# Patient Record
Sex: Male | Born: 1965 | Race: White | Hispanic: No | Marital: Married | Smoking: Never smoker
Health system: Southern US, Community
[De-identification: ages and names within clinical notes are randomized; demographics above are authoritative.]

## PROBLEM LIST (undated history)

## (undated) DIAGNOSIS — M109 Gout, unspecified: Secondary | ICD-10-CM

## (undated) DIAGNOSIS — K219 Gastro-esophageal reflux disease without esophagitis: Secondary | ICD-10-CM

## (undated) DIAGNOSIS — I350 Nonrheumatic aortic (valve) stenosis: Secondary | ICD-10-CM

## (undated) DIAGNOSIS — M1990 Unspecified osteoarthritis, unspecified site: Secondary | ICD-10-CM

## (undated) DIAGNOSIS — R011 Cardiac murmur, unspecified: Secondary | ICD-10-CM

## (undated) DIAGNOSIS — E785 Hyperlipidemia, unspecified: Secondary | ICD-10-CM

## (undated) DIAGNOSIS — I10 Essential (primary) hypertension: Secondary | ICD-10-CM

## (undated) HISTORY — DX: Hyperlipidemia, unspecified: E78.5

## (undated) HISTORY — DX: Nonrheumatic aortic (valve) stenosis: I35.0

## (undated) HISTORY — PX: JOINT REPLACEMENT: SHX530

## (undated) HISTORY — PX: FACIAL RECONSTRUCTION SURGERY: SHX631

## (undated) HISTORY — DX: Gastro-esophageal reflux disease without esophagitis: K21.9

## (undated) HISTORY — DX: Unspecified osteoarthritis, unspecified site: M19.90

## (undated) HISTORY — DX: Essential (primary) hypertension: I10

## (undated) HISTORY — DX: Cardiac murmur, unspecified: R01.1

---

## 2000-10-19 ENCOUNTER — Ambulatory Visit (HOSPITAL_COMMUNITY): Admission: RE | Admit: 2000-10-19 | Discharge: 2000-10-19 | Payer: Self-pay | Admitting: Orthopedic Surgery

## 2001-11-24 ENCOUNTER — Emergency Department (HOSPITAL_COMMUNITY): Admission: EM | Admit: 2001-11-24 | Discharge: 2001-11-24 | Payer: Self-pay | Admitting: Emergency Medicine

## 2001-11-24 ENCOUNTER — Encounter: Payer: Self-pay | Admitting: Emergency Medicine

## 2005-09-05 ENCOUNTER — Ambulatory Visit (HOSPITAL_COMMUNITY): Admission: RE | Admit: 2005-09-05 | Discharge: 2005-09-05 | Payer: Self-pay | Admitting: Physician Assistant

## 2006-09-02 ENCOUNTER — Emergency Department (HOSPITAL_COMMUNITY): Admission: EM | Admit: 2006-09-02 | Discharge: 2006-09-02 | Payer: Self-pay | Admitting: *Deleted

## 2010-08-24 NOTE — Consult Note (Signed)
William Welch, William Welch NO.:  [id number]   MEDICAL RECORD NO.:  [id number]          PATIENT TYPE:  EMS   LOCATION:  [facility]                         FACILITY:  [facility]   PHYSICIAN:  Madelynn Done, MD  DATE OF BIRTH:  [birth year]   DATE OF CONSULTATION:  09/02/2006  DATE OF DISCHARGE:                                 CONSULTATION   REASON FOR CONSULTATION:  Left thumb crush injury.   REQUESTING PHYSICIAN:  Doug Sou, M.D., emergency department.   HISTORY OF PRESENT ILLNESS:  William Welch is a 45 year old, right-hand  dominant gentleman who sustained a crush injury to his left thumb, where  he got his thumb caught between two stationary objects.  The patient  presented to the ER with pain and bleeding to the dorsum aspect of his  left thumb.  The patient denies any previous trauma to the left thumb.   IMMUNIZATIONS:  His tetanus shot was up-to-date within the last 5 years.   PAST MEDICAL HISTORY:  No major medical illnesses.   PAST SURGICAL HISTORY:  1. Right knee surgery.  2. Lateral release and bipartite patella excision.   MEDICATIONS:  None.   ALLERGIES:  None known.   SOCIAL HISTORY:  He works in [employer] conservation [employer] business.  He is a  nonsmoker.  He is married.   REVIEW OF SYSTEMS:  No recent illnesses or hospitalizations.   PHYSICAL EXAMINATION:  GENERAL:  He is a healthy-appearing white male in  no acute distress.  He is alert and oriented to person, place and time.  MUSCULOSKELETAL:  On examination of the left hand and thumb, he does  have an approximately 2-cm laceration over the radial border of the  right dorsum eponychial region.  There is not a significant subungual  hematoma.  He is able to extend his thumb from a palm flat position,  flex his thumb IP joint, make the A-okay sign.  He has good composite  grip of his remaining digits.  There are no other areas of trauma or  swelling to the hand.  His fingertips are well perfused.  Sensation  to  light touch is present distally.   RADIOGRAPHS:  AP and lateral films of the left thumb do show a minimally  displaced fracture of the distal phalanx which involves the articular  surface.   IMPRESSION:  1. Left thumb crush injury.  2. Distal phalanx fracture with a small open wound.   PLAN:  Today, the patient's radiographs were reviewed with he and his  wife.  He does have a small laceration and I felt it would be amenable  to just a simple laceration repair.  The patient agreed to proceed.   Lidocaine 1%, 8 mL was used to perform a digital block.  The patient  tolerated this well.  The finger was then prepped with Betadine and then  sterilely draped.  Two 5-0 Prolene sutures were then placed to repair  the simple laceration to the thumb.  The patient tolerated this well.  A  Xeroform dressing and a sterile compressive dressing were then applied.  The patient tolerated the procedure well.  The wound in the thumb was  thoroughly irrigated prior to wound closure and cleansed with Betadine  throughout.   The patient will be seen back in the office in 9 days for a wound check  and suture removal.  He is to keep his bandage clean and dry.  No using  the left thumb.  He has pain medications at home from his previous  surgery which he is going to use.  He is going to come back and see me  sooner if he has problems.      Madelynn Done, MD  Electronically Signed     FWO/MEDQ  D:  09/02/2006  T:  09/02/2006  Job:  [id number]

## 2016-07-26 DIAGNOSIS — T1512XA Foreign body in conjunctival sac, left eye, initial encounter: Secondary | ICD-10-CM | POA: Diagnosis not present

## 2017-05-04 DIAGNOSIS — Z1389 Encounter for screening for other disorder: Secondary | ICD-10-CM | POA: Diagnosis not present

## 2017-05-04 DIAGNOSIS — R011 Cardiac murmur, unspecified: Secondary | ICD-10-CM | POA: Diagnosis not present

## 2017-05-04 DIAGNOSIS — M10062 Idiopathic gout, left knee: Secondary | ICD-10-CM | POA: Diagnosis not present

## 2017-06-16 ENCOUNTER — Encounter: Payer: Self-pay | Admitting: Internal Medicine

## 2017-08-26 ENCOUNTER — Other Ambulatory Visit: Payer: Self-pay

## 2017-08-26 ENCOUNTER — Emergency Department
Admission: EM | Admit: 2017-08-26 | Discharge: 2017-08-26 | Disposition: A | Discharge status: Home / Self Care | Payer: 59 | Attending: Emergency Medicine | Admitting: Emergency Medicine

## 2017-08-26 ENCOUNTER — Encounter: Payer: Self-pay | Admitting: Emergency Medicine

## 2017-08-26 DIAGNOSIS — Y92019 Unspecified place in single-family (private) house as the place of occurrence of the external cause: Secondary | ICD-10-CM | POA: Diagnosis not present

## 2017-08-26 DIAGNOSIS — S0101XA Laceration without foreign body of scalp, initial encounter: Secondary | ICD-10-CM

## 2017-08-26 DIAGNOSIS — W268XXA Contact with other sharp object(s), not elsewhere classified, initial encounter: Secondary | ICD-10-CM | POA: Diagnosis not present

## 2017-08-26 DIAGNOSIS — Y998 Other external cause status: Secondary | ICD-10-CM | POA: Diagnosis not present

## 2017-08-26 DIAGNOSIS — Y9389 Activity, other specified: Secondary | ICD-10-CM | POA: Diagnosis not present

## 2017-08-26 DIAGNOSIS — S0990XA Unspecified injury of head, initial encounter: Secondary | ICD-10-CM | POA: Diagnosis not present

## 2017-08-26 HISTORY — DX: Gout, unspecified: M10.9

## 2017-08-26 MED ORDER — LIDOCAINE-EPINEPHRINE-TETRACAINE (LET) SOLUTION
NASAL | Status: AC
Start: 2017-08-26 — End: 2017-08-26
  Administered 2017-08-26: 3 mL via TOPICAL
  Filled 2017-08-26: qty 3

## 2017-08-26 MED ORDER — TRAMADOL HCL 50 MG PO TABS: 50.0000 mg | ORAL_TABLET | Freq: Four times a day (QID) | ORAL | 0 refills | Status: DC | PRN

## 2017-08-26 MED ORDER — TRAMADOL HCL 50 MG PO TABS
50.0000 mg | ORAL_TABLET | Freq: Once | ORAL | Status: AC
  Administered 2017-08-26: 50 mg via ORAL
  Filled 2017-08-26: qty 1

## 2017-08-26 MED ORDER — LIDOCAINE-EPINEPHRINE-TETRACAINE (LET) SOLUTION
3.0000 mL | Freq: Once | NASAL | Status: AC
  Administered 2017-08-26: 3 mL via TOPICAL

## 2017-08-26 NOTE — ED Triage Notes (Signed)
Was working under house and patient cut top of head on something.  Laceration to top of head, approximately 3 inches log.  Bleeding controlled.

## 2017-08-26 NOTE — ED Provider Notes (Signed)
[facility] Emergency Department Provider Note   ____________________________________________   First MD Initiated Contact with Patient 08/26/17 1323     (approximate)  I have reviewed the triage vital signs and the nursing notes.   HISTORY  Chief Complaint Laceration    HPI William Welch is a 52 y.o. male patient presents with scalp laceration.  Patient was working in his house stood up abruptly and cut his hair on a piece of metal.  Patient denies LOC.  Bleeding controlled with direct pressure.  Patient denies vertigo vision disturbance.  Patient tetanus shot is up-to-date.  Patient denies pain at this time.  Past Medical History:  Diagnosis Date  . Gout     There are no active problems to display for this patient.     Prior to Admission medications   Medication Sig Start Date End Date Taking? Authorizing Provider  traMADol (ULTRAM) 50 MG tablet Take 1 tablet (50 mg total) by mouth every 6 (six) hours as needed for moderate pain. 08/26/17   Sable Feil, PA-C    Allergies Percocet [oxycodone-acetaminophen]  No family history on file.  Social History Social History   Tobacco Use  . Smoking status: Never Smoker  . Smokeless tobacco: Never Used  Substance Use Topics  . Alcohol use: Not on file  . Drug use: Not on file    Review of Systems Constitutional: No fever/chills Eyes: No visual changes. ENT: No sore throat. Cardiovascular: Denies chest pain. Respiratory: Denies shortness of breath. Gastrointestinal: No abdominal pain.  No nausea, no vomiting.  No diarrhea.  No constipation. Genitourinary: Negative for dysuria. Musculoskeletal: Negative for back pain. Skin: Scalp laceration. Neurological: Negative for headaches, focal weakness or numbness. Endocrine:Gout Allergic/Immunilogical: Percocet ____________________________________________   PHYSICAL EXAM:  VITAL SIGNS: ED Triage Vitals  Enc Vitals Group     BP  08/26/17 1253 (!) 166/96     Pulse Rate 08/26/17 1253 91     Resp 08/26/17 1253 16     Temp 08/26/17 1253 98 F (36.7 C)     Temp Source 08/26/17 1253 Oral     SpO2 08/26/17 1253 97 %     Weight 08/26/17 1251 150 lb (68 kg)     Height 08/26/17 1251 5\' 6"  (1.676 m)     Head Circumference --      Peak Flow --      Pain Score 08/26/17 1251 0     Pain Loc --      Pain Edu? --      Excl. in [place]? --    Constitutional: Alert and oriented. Well appearing and in no acute distress. Cardiovascular: Normal rate, regular rhythm. Grossly normal heart sounds.  Good peripheral circulation. Respiratory: Normal respiratory effort.  No retractions. Lungs CTAB. Musculoskeletal: No lower extremity tenderness nor edema.  No joint effusions. Neurologic:  Normal speech and language. No gross focal neurologic deficits are appreciated. No gait instability. Skin:  Skin is warm, dry and intact. No rash noted. Psychiatric: Mood and affect are normal. Speech and behavior are normal.  ____________________________________________   LABS (all labs ordered are listed, but only abnormal results are displayed)  Labs Reviewed - No data to display ____________________________________________  EKG   ____________________________________________  RADIOLOGY  ED MD interpretation:    Official radiology report(s): No results found.  ____________________________________________   PROCEDURES  Procedure(s) performed: None  .Marland KitchenLaceration Repair Date/Time: 08/26/2017 2:21 PM Performed by: Sable Feil, PA-C Authorized by: Sable Feil, PA-C  Consent:    Consent obtained:  Verbal   Consent given by:  Patient   Risks discussed:  Infection, pain and poor cosmetic result Anesthesia (see MAR for exact dosages):    Anesthesia method:  Topical application   Topical anesthetic:  LET Laceration details:    Location:  Scalp   Scalp location:  Crown   Length (cm):  2.5 Repair type:    Repair type:   Simple Pre-procedure details:    Preparation:  Patient was prepped and draped in usual sterile fashion Exploration:    Hemostasis achieved with:  LET Treatment:    Area cleansed with:  Betadine and saline   Amount of cleaning:  Standard   Visualized foreign bodies/material removed: no   Skin repair:    Repair method:  Staples   Number of staples:  5 Approximation:    Approximation:  Close Post-procedure details:    Dressing:  Open (no dressing)   Patient tolerance of procedure:  Tolerated well, no immediate complications    Critical Care performed:   ____________________________________________   INITIAL IMPRESSION / ASSESSMENT AND PLAN / ED COURSE  As part of my medical decision making, I reviewed the following data within the electronic MEDICAL RECORD NUMBER    Scalp laceration.  Area was surgical clean and closed with 5 staples.  Patient given discharge care instructions.  Patient advised to have staples removed by this department or family doctor in 10 days.     ____________________________________________   FINAL CLINICAL IMPRESSION(S) / ED DIAGNOSES  Final diagnoses:  Scalp laceration, initial encounter     ED Discharge Orders        Ordered    traMADol (ULTRAM) 50 MG tablet  Every 6 hours PRN     08/26/17 1420       Note:  This document was prepared using Dragon voice recognition software and may include unintentional dictation errors.    Sable Feil, PA-C 08/26/17 1425    Lavonia Drafts, MD 08/28/17 [phone]

## 2017-08-26 NOTE — ED Notes (Signed)

## 2017-08-26 NOTE — Discharge Instructions (Addendum)
Follow discharge care instructions have staples removed in 10 days.

## 2018-04-30 DIAGNOSIS — R82998 Other abnormal findings in urine: Secondary | ICD-10-CM | POA: Diagnosis not present

## 2018-04-30 DIAGNOSIS — Z0000 Encounter for general adult medical examination without abnormal findings: Secondary | ICD-10-CM | POA: Diagnosis not present

## 2018-05-07 DIAGNOSIS — R030 Elevated blood-pressure reading, without diagnosis of hypertension: Secondary | ICD-10-CM | POA: Diagnosis not present

## 2018-05-07 DIAGNOSIS — M109 Gout, unspecified: Secondary | ICD-10-CM | POA: Diagnosis not present

## 2018-05-07 DIAGNOSIS — Z0000 Encounter for general adult medical examination without abnormal findings: Secondary | ICD-10-CM | POA: Diagnosis not present

## 2018-05-07 DIAGNOSIS — R011 Cardiac murmur, unspecified: Secondary | ICD-10-CM | POA: Diagnosis not present

## 2018-05-09 DIAGNOSIS — Z1212 Encounter for screening for malignant neoplasm of rectum: Secondary | ICD-10-CM | POA: Diagnosis not present

## 2018-06-06 ENCOUNTER — Encounter: Payer: Self-pay | Admitting: Gastroenterology

## 2018-06-12 ENCOUNTER — Encounter (INDEPENDENT_AMBULATORY_CARE_PROVIDER_SITE_OTHER): Payer: Self-pay

## 2018-06-12 ENCOUNTER — Ambulatory Visit (AMBULATORY_SURGERY_CENTER): Payer: Self-pay

## 2018-06-12 ENCOUNTER — Encounter: Payer: Self-pay | Admitting: Gastroenterology

## 2018-06-12 VITALS — Ht 66.0 in | Wt 146.4 lb

## 2018-06-12 DIAGNOSIS — Z1211 Encounter for screening for malignant neoplasm of colon: Secondary | ICD-10-CM

## 2018-06-12 MED ORDER — PEG-KCL-NACL-NASULF-NA ASC-C 140 G PO SOLR: 1.0000 | Freq: Once | ORAL | 0 refills | Status: AC

## 2018-06-12 NOTE — Progress Notes (Signed)
Denies allergies to eggs or soy products. Denies complication of anesthesia or sedation. Denies use of weight loss medication. Denies use of O2.   Emmi instructions declined.   A pay no more than 50.00 coupon for Plenvu was given to the patient.

## 2018-06-25 ENCOUNTER — Telehealth: Payer: Self-pay | Admitting: *Deleted

## 2018-06-25 NOTE — Telephone Encounter (Signed)
Covid-19 travel screening questions  Have you traveled in the last 14 days? No If yes where?  Do you now or have you had a fever in the last 14 days? No  Do you have any respiratory symptoms of shortness of breath or cough now or in the last 14 days? No  Do you have any family members or close contacts with diagnosed or suspected Covid-19? No       

## 2018-06-26 ENCOUNTER — Encounter: Payer: Self-pay | Admitting: Gastroenterology

## 2018-06-26 ENCOUNTER — Ambulatory Visit (AMBULATORY_SURGERY_CENTER): Payer: 59 | Admitting: Gastroenterology

## 2018-06-26 ENCOUNTER — Other Ambulatory Visit: Payer: Self-pay

## 2018-06-26 VITALS — BP 126/82 | HR 70 | Temp 97.5°F | Resp 16 | Ht 66.0 in | Wt 146.0 lb

## 2018-06-26 DIAGNOSIS — K635 Polyp of colon: Secondary | ICD-10-CM | POA: Diagnosis not present

## 2018-06-26 DIAGNOSIS — D122 Benign neoplasm of ascending colon: Secondary | ICD-10-CM

## 2018-06-26 DIAGNOSIS — Z1211 Encounter for screening for malignant neoplasm of colon: Secondary | ICD-10-CM

## 2018-06-26 MED ORDER — SODIUM CHLORIDE 0.9 % IV SOLN: 500.0000 mL | Freq: Once | INTRAVENOUS | Status: DC

## 2018-06-26 NOTE — Progress Notes (Signed)
Covid-19 travel screening questions  Have you traveled in the last 14 days? If yes where?  Do you now or have you had a fever in the last 14 days?  Do you have any respiratory symptoms of shortness of breath or cough now or in the last 14 days? no  Do you have a medical history of Congestive Heart Failure?no  Do you have a medical history of lung disease? no  Do you have any family members or close contacts with diagnosed or suspected Covid-19? no

## 2018-06-26 NOTE — Op Note (Signed)
[place] Patient Name: William Welch Procedure Date: 06/26/2018 1:47 PM MRN: [id number] Endoscopist: Mallie Mussel L. Loletha Carrow , MD Age: 53 Referring MD:  Date of Birth: 1965-11-12 Gender: Male Account #: [id number] Procedure:                Colonoscopy Indications:              Screening for colorectal malignant neoplasm, This                            is the patient's first colonoscopy Medicines:                Monitored Anesthesia Care Procedure:                Pre-Anesthesia Assessment:                           - Prior to the procedure, a History and Physical                            was performed, and patient medications and                            allergies were reviewed. The patient's tolerance of                            previous anesthesia was also reviewed. The risks                            and benefits of the procedure and the sedation                            options and risks were discussed with the patient.                            All questions were answered, and informed consent                            was obtained. Prior Anticoagulants: The patient has                            taken no previous anticoagulant or antiplatelet                            agents. ASA Grade Assessment: II - A patient with                            mild systemic disease. After reviewing the risks                            and benefits, the patient was deemed in                            satisfactory condition to undergo the procedure.  After obtaining informed consent, the colonoscope                            was passed under direct vision. Throughout the                            procedure, the patient's blood pressure, pulse, and                            oxygen saturations were monitored continuously. The                            Colonoscope was introduced through the anus and                            advanced to the the cecum,  identified by                            appendiceal orifice and ileocecal valve. The                            colonoscopy was performed without difficulty. The                            patient tolerated the procedure well. The quality                            of the bowel preparation was excellent. The                            ileocecal valve, appendiceal orifice, and rectum                            were photographed. Scope In: 1:55:57 PM Scope Out: 2:08:24 PM Scope Withdrawal Time: 0 hours 9 minutes 16 seconds  Total Procedure Duration: 0 hours 12 minutes 27 seconds  Findings:                 The perianal and digital rectal examinations were                            normal.                           Two sessile polyps were found in the ascending                            colon. The polyps were diminutive in size. These                            polyps were removed with a cold biopsy forceps.                            Resection and retrieval were complete.  A few small-mouthed diverticula were found in the                            sigmoid colon.                           The exam was otherwise without abnormality on                            direct and retroflexion views. Complications:            No immediate complications. Estimated Blood Loss:     Estimated blood loss was minimal. Impression:               - Two diminutive polyps in the ascending colon,                            removed with a cold biopsy forceps. Resected and                            retrieved.                           - Diverticulosis in the sigmoid colon.                           - The examination was otherwise normal on direct                            and retroflexion views. Recommendation:           - Patient has a contact number available for                            emergencies. The signs and symptoms of potential                            delayed  complications were discussed with the                            patient. Return to normal activities tomorrow.                            Written discharge instructions were provided to the                            patient.                           - Resume previous diet.                           - Continue present medications.                           - Await pathology results.                           -  Repeat colonoscopy is recommended for                            surveillance. The colonoscopy date will be                            determined after pathology results from today's                            exam become available for review. Josh Nicolosi L. Loletha Carrow, MD 06/26/2018 2:12:01 PM This report has been signed electronically.

## 2018-06-26 NOTE — Progress Notes (Signed)
To PACU, VSS. Report to [email or web address].tb 

## 2018-06-26 NOTE — Patient Instructions (Signed)
YOU HAD AN ENDOSCOPIC PROCEDURE TODAY AT [facility]:   Refer to the procedure report that was given to you for any specific questions about what was found during the examination.  If the procedure report does not answer your questions, please call your gastroenterologist to clarify.  If you requested that your care partner not be given the details of your procedure findings, then the procedure report has been included in a sealed envelope for you to review at your convenience later.  YOU SHOULD EXPECT: Some feelings of bloating in the abdomen. Passage of more gas than usual.  Walking can help get rid of the air that was put into your GI tract during the procedure and reduce the bloating. If you had a lower endoscopy (such as a colonoscopy or flexible sigmoidoscopy) you may notice spotting of blood in your stool or on the toilet paper. If you underwent a bowel prep for your procedure, you may not have a normal bowel movement for a few days.  Please Note:  You might notice some irritation and congestion in your nose or some drainage.  This is from the oxygen used during your procedure.  There is no need for concern and it should clear up in a day or so.  SYMPTOMS TO REPORT IMMEDIATELY:   Following lower endoscopy (colonoscopy or flexible sigmoidoscopy):  Excessive amounts of blood in the stool  Significant tenderness or worsening of abdominal pains  Swelling of the abdomen that is new, acute  Fever of 100F or higher             Black, tarry-looking stools  For urgent or emergent issues, a gastroenterologist can be reached at any hour by calling [phone].  Please see handouts given to you on Polyps and Diverticulosis.  DIET:  We do recommend a small meal at first, but then you may proceed to your regular diet.  Drink plenty of fluids but you should avoid alcoholic beverages for 24 hours.  ACTIVITY:  You should plan to take it easy for the rest of today and you should NOT  DRIVE or use heavy machinery until tomorrow (because of the sedation medicines used during the test).    FOLLOW UP: Our staff will call the number listed on your records the next business day following your procedure to check on you and address any questions or concerns that you may have regarding the information given to you following your procedure. If we do not reach you, we will leave a message.  However, if you are feeling well and you are not experiencing any problems, there is no need to return our call.  We will assume that you have returned to your regular daily activities without incident.  If any biopsies were taken you will be contacted by phone or by letter within the next 1-3 weeks.  Please call us at [phone] if you have not heard about the biopsies in 3 weeks.    SIGNATURES/CONFIDENTIALITY: You and/or your care partner have signed paperwork which will be entered into your electronic medical record.  These signatures attest to the fact that that the information above on your After Visit Summary has been reviewed and is understood.  Full responsibility of the confidentiality of this discharge information lies with you and/or your care-partner.  Thank you for letting us take care of your healthcare needs today.

## 2018-06-26 NOTE — Progress Notes (Signed)
Pt's states no medical or surgical changes since previsit or office visit. 

## 2018-06-26 NOTE — Progress Notes (Signed)
Called to room to assist during endoscopic procedure.  Patient ID and intended procedure confirmed with present staff. Received instructions for my participation in the procedure from the performing physician.  

## 2018-06-27 ENCOUNTER — Telehealth: Payer: Self-pay

## 2018-06-27 NOTE — Telephone Encounter (Signed)
  Follow up Call-  Call back number 06/26/2018  Post procedure Call Back phone  # [phone]  Permission to leave phone message Yes  Some recent data might be hidden     Patient questions:  Do you have a fever, pain , or abdominal swelling? No. Pain Score  0 *  Have you tolerated food without any problems? Yes.    Have you been able to return to your normal activities? Yes.    Do you have any questions about your discharge instructions: Diet   No. Medications  No. Follow up visit  No.  Do you have questions or concerns about your Care? No.  Actions: * If pain score is 4 or above: No action needed, pain <4.

## 2018-07-03 ENCOUNTER — Encounter: Payer: Self-pay | Admitting: Gastroenterology

## 2020-06-14 NOTE — Progress Notes (Signed)
Cardiology Office Note:    Date:  06/16/2020   ID:  William Welch, DOB 10-02-1965, MRN [id number]  PCP:  William Welch, [place]  Cardiologist:  No primary care provider on file.  Advanced Practice Provider:  No care team member to display Electrophysiologist:  None    Referring MD: William Battles, MD    History of Present Illness:    William Welch is a 55 y.o. male with a hx of HTN, HLD and GERD who was referred by Dr. Philip Welch for further evaluation of dyspnea on exertion and systolic murmur.  Patient states that over the past 6 months he has been experiencing worsening dyspnea on exertion. Has been progressive since that time. Most notable when walking at a fast pace or climbing stairs. No associated chest pain, lightheadedness, palpitations, LE edema, orthopnea or PND. Never smoker. Currently on crestor. Not on antihypertensives.   Family history: Father with MI at 78 (deceased). No known history of valvular disease.    TC 197, HDL 61, LDL 123, TG 74  Past Medical History:  Diagnosis Date  . Arthritis   . GERD (gastroesophageal reflux disease)   . Gout   . Heart murmur   . Hyperlipidemia   . Hypertension     Past Surgical History:  Procedure Laterality Date  . FACIAL RECONSTRUCTION SURGERY    . JOINT REPLACEMENT      Current Medications: Current Meds  Medication Sig  . allopurinol (ZYLOPRIM) 300 MG tablet Take 300 mg by mouth daily.  . colchicine 0.6 MG tablet Take 0.6 mg by mouth as needed.  . ezetimibe (ZETIA) 10 MG tablet Take 1 tablet (10 mg total) by mouth daily.  Marland Kitchen omeprazole (PRILOSEC) 40 MG capsule Take 40 mg by mouth daily.  . rosuvastatin (CRESTOR) 20 MG tablet Take 1 tablet by mouth daily.  . valsartan (DIOVAN) 40 MG tablet Take 1 tablet (40 mg total) by mouth daily.     Allergies:   Percocet [oxycodone-acetaminophen]   Social History   Socioeconomic History  . Marital status: Married    Spouse name: Not on  file  . Number of children: Not on file  . Years of education: Not on file  . Highest education level: Not on file  Occupational History  . Not on file  Tobacco Use  . Smoking status: Never Smoker  . Smokeless tobacco: Never Used  Substance and Sexual Activity  . Alcohol use: Yes    Comment: drinks 5 or 6 beers daily.  . Drug use: Not Currently  . Sexual activity: Not on file  Other Topics Concern  . Not on file  Social History Narrative  . Not on file   Social Determinants of Health   Financial Resource Strain: Not on file  Food Insecurity: Not on file  Transportation Needs: Not on file  Physical Activity: Not on file  Stress: Not on file  Social Connections: Not on file     Family History: The patient's family history is negative for Colon cancer, Esophageal cancer, Rectal cancer, and Stomach cancer.  ROS:   Please see the history of present illness.    Review of Systems  Constitutional: Negative for chills and fever.  HENT: Negative for hearing loss.   Eyes: Negative for blurred vision and redness.  Respiratory: Positive for shortness of breath.   Cardiovascular: Negative for chest pain, palpitations, orthopnea, claudication, leg swelling and PND.  Gastrointestinal: Negative for melena, nausea and vomiting.  Genitourinary: Negative for dysuria and flank pain.  Musculoskeletal: Negative for myalgias.  Neurological: Negative for dizziness and loss of consciousness.  Endo/Heme/Allergies: Negative for polydipsia.  Psychiatric/Behavioral: Negative for substance abuse.    EKGs/Labs/Other Studies Reviewed:    The following studies were reviewed today: No cardiac studies  EKG:  EKG is  ordered today.  The ekg ordered today demonstrates NSR with LVH and repol  Recent Labs: No results found for requested labs within last 8760 hours.  Recent Lipid Panel No results found for: CHOL, TRIG, HDL, CHOLHDL, VLDL, LDLCALC, LDLDIRECT   Risk Assessment/Calculations:        Physical Exam:    VS:  BP (!) 136/100   Pulse 73   Ht 5\' 6"  (1.676 m)   Wt 145 lb 9.6 oz (66 kg)   SpO2 97%   BMI 23.50 kg/m     Wt Readings from Last 3 Encounters:  06/16/20 145 lb 9.6 oz (66 kg)  06/26/18 146 lb (66.2 kg)  06/12/18 146 lb 6.4 oz (66.4 kg)     GEN:  Well nourished, well developed in no acute distress HEENT: Normal NECK: No JVD; No carotid bruits CARDIAC: RRR, 3/6 loud holosystolic murmur heard throughout the precordium. No rubs or gallops RESPIRATORY:  Clear to auscultation without rales, wheezing or rhonchi  ABDOMEN: Soft, non-tender, non-distended MUSCULOSKELETAL:  No edema; No deformity  SKIN: Warm and dry NEUROLOGIC:  Alert and oriented x 3 PSYCHIATRIC:  Normal affect   ASSESSMENT:    1. Murmur   2. Hyperlipidemia, unspecified hyperlipidemia type   3. Hypertension, unspecified type   4. Dyspnea on exertion    PLAN:    In order of problems listed above:  #Systolic Murmur #DOE: Patient with progressive dyspnea on exertion over the past 6 months found to have a loud holosystolic murmur on examination heard throughout the precordium. Findings concerning for symptomatic valvular disease (? MR vs possibly AS). No LE edema, orthopnea, PND, lightheadedness, chest pain or syncope. Will check TTE and pursue further work-up pending echo findings -Check TTE -If TTE without significant valve disease to explain symptoms, will proceed with ischemic work-up  #HTN: ECG with LVH and strain. Suspect elevated blood pressures at home. Not on antihypertensives currently. -Start valsartan 40mg  daily -BMET next week  #HLD: LDL above goal at 123 despite crestor 20mg  daily. -Start zetia 10mg  daily -Continue crestor 20mg  daily -Repeat lipids 6 weeks    Medication Adjustments/Labs and Tests Ordered: Current medicines are reviewed at length with the patient today.  Concerns regarding medicines are outlined above.  Orders Placed This Encounter  Procedures  .  Basic metabolic panel  . EKG 12-Lead  . ECHOCARDIOGRAM COMPLETE   Meds ordered this encounter  Medications  . ezetimibe (ZETIA) 10 MG tablet    Sig: Take 1 tablet (10 mg total) by mouth daily.    Dispense:  90 tablet    Refill:  3  . valsartan (DIOVAN) 40 MG tablet    Sig: Take 1 tablet (40 mg total) by mouth daily.    Dispense:  90 tablet    Refill:  3    Patient Instructions  Medication Instructions:  Start Zetia 10mg  daily Valsartan 40 mg daily  Your physician recommends that you continue on your current medications as directed. Please refer to the Current Medication list given to you today. *If you need a refill on your cardiac medications before your next appointment, please call your pharmacy*   Lab Work: BMET next week If  you have labs (blood work) drawn today and your tests are completely normal, you will receive your results only by: Marland Kitchen MyChart Message (if you have MyChart) OR . A paper copy in the mail If you have any lab test that is abnormal or we need to change your treatment, we will call you to review the results.   Testing/Procedures: ECHO ASAP Your physician has requested that you have an echocardiogram. Echocardiography is a painless test that uses sound waves to create images of your heart. It provides your doctor with information about the size and shape of your heart and how well your heart's chambers and valves are working. This procedure takes approximately one hour. There are no restrictions for this procedure.    Follow-Up: At [facility], you and your health needs are our priority.  As part of our continuing mission to provide you with exceptional heart care, we have created designated Provider Care Teams.  These Care Teams include your primary Cardiologist (physician) and Advanced Practice Providers (APPs -  Physician Assistants and Nurse Practitioners) who all work together to provide you with the care you need, when you need it.  We recommend  signing up for the patient portal called "MyChart".  Sign up information is provided on this After Visit Summary.  MyChart is used to connect with patients for Virtual Visits (Telemedicine).  Patients are able to view lab/test results, encounter notes, upcoming appointments, etc.  Non-urgent messages can be sent to your provider as well.   To learn more about what you can do with MyChart, go to [email or web address].    Your next appointment:   6 week(s) after ECHO  The format for your next appointment:   In Person  Provider:   You may see Dr. Gwyndolyn Kaufman or one of the following Advanced Practice Providers on your designated Care Team:    Richardson Dopp, PA-C  Robbie Lis, [place]         Signed, Freada Bergeron, MD  06/16/2020 1:16 PM    [place]

## 2020-06-14 NOTE — H&P (View-Only) (Signed)
Cardiology Office Note:    Date:  06/16/2020   ID:  William Welch, DOB [birth year], MRN [id number]  PCP:  Leanna Battles, [place]  Cardiologist:  No primary care provider on file.  Advanced Practice Provider:  No care team member to display Electrophysiologist:  None    Referring MD: Leanna Battles, MD    History of Present Illness:    William Welch is a 55 y.o. male with a hx of HTN, HLD and GERD who was referred by Dr. Philip Aspen for further evaluation of dyspnea on exertion and systolic murmur.  Patient states that over the past 6 months he has been experiencing worsening dyspnea on exertion. Has been progressive since that time. Most notable when walking at a fast pace or climbing stairs. No associated chest pain, lightheadedness, palpitations, LE edema, orthopnea or PND. Never smoker. Currently on crestor. Not on antihypertensives.   Family history: Father with MI at 40 (deceased). No known history of valvular disease.    TC 197, HDL 61, LDL 123, TG 74  Past Medical History:  Diagnosis Date  . Arthritis   . GERD (gastroesophageal reflux disease)   . Gout   . Heart murmur   . Hyperlipidemia   . Hypertension     Past Surgical History:  Procedure Laterality Date  . FACIAL RECONSTRUCTION SURGERY    . JOINT REPLACEMENT      Current Medications: Current Meds  Medication Sig  . allopurinol (ZYLOPRIM) 300 MG tablet Take 300 mg by mouth daily.  . colchicine 0.6 MG tablet Take 0.6 mg by mouth as needed.  . ezetimibe (ZETIA) 10 MG tablet Take 1 tablet (10 mg total) by mouth daily.  Marland Kitchen omeprazole (PRILOSEC) 40 MG capsule Take 40 mg by mouth daily.  . rosuvastatin (CRESTOR) 20 MG tablet Take 1 tablet by mouth daily.  . valsartan (DIOVAN) 40 MG tablet Take 1 tablet (40 mg total) by mouth daily.     Allergies:   Percocet [oxycodone-acetaminophen]   Social History   Socioeconomic History  . Marital status: Married    Spouse name: Not on  file  . Number of children: Not on file  . Years of education: Not on file  . Highest education level: Not on file  Occupational History  . Not on file  Tobacco Use  . Smoking status: Never Smoker  . Smokeless tobacco: Never Used  Substance and Sexual Activity  . Alcohol use: Yes    Comment: drinks 5 or 6 beers daily.  . Drug use: Not Currently  . Sexual activity: Not on file  Other Topics Concern  . Not on file  Social History Narrative  . Not on file   Social Determinants of Health   Financial Resource Strain: Not on file  Food Insecurity: Not on file  Transportation Needs: Not on file  Physical Activity: Not on file  Stress: Not on file  Social Connections: Not on file     Family History: The patient's family history is negative for Colon cancer, Esophageal cancer, Rectal cancer, and Stomach cancer.  ROS:   Please see the history of present illness.    Review of Systems  Constitutional: Negative for chills and fever.  HENT: Negative for hearing loss.   Eyes: Negative for blurred vision and redness.  Respiratory: Positive for shortness of breath.   Cardiovascular: Negative for chest pain, palpitations, orthopnea, claudication, leg swelling and PND.  Gastrointestinal: Negative for melena, nausea and vomiting.  Genitourinary: Negative for dysuria and flank pain.  Musculoskeletal: Negative for myalgias.  Neurological: Negative for dizziness and loss of consciousness.  Endo/Heme/Allergies: Negative for polydipsia.  Psychiatric/Behavioral: Negative for substance abuse.    EKGs/Labs/Other Studies Reviewed:    The following studies were reviewed today: No cardiac studies  EKG:  EKG is  ordered today.  The ekg ordered today demonstrates NSR with LVH and repol  Recent Labs: No results found for requested labs within last 8760 hours.  Recent Lipid Panel No results found for: CHOL, TRIG, HDL, CHOLHDL, VLDL, LDLCALC, LDLDIRECT   Risk Assessment/Calculations:        Physical Exam:    VS:  BP (!) 136/100   Pulse 73   Ht 5\' 6"  (1.676 m)   Wt 145 lb 9.6 oz (66 kg)   SpO2 97%   BMI 23.50 kg/m     Wt Readings from Last 3 Encounters:  06/16/20 145 lb 9.6 oz (66 kg)  06/26/18 146 lb (66.2 kg)  06/12/18 146 lb 6.4 oz (66.4 kg)     GEN:  Well nourished, well developed in no acute distress HEENT: Normal NECK: No JVD; No carotid bruits CARDIAC: RRR, 3/6 loud holosystolic murmur heard throughout the precordium. No rubs or gallops RESPIRATORY:  Clear to auscultation without rales, wheezing or rhonchi  ABDOMEN: Soft, non-tender, non-distended MUSCULOSKELETAL:  No edema; No deformity  SKIN: Warm and dry NEUROLOGIC:  Alert and oriented x 3 PSYCHIATRIC:  Normal affect   ASSESSMENT:    1. Murmur   2. Hyperlipidemia, unspecified hyperlipidemia type   3. Hypertension, unspecified type   4. Dyspnea on exertion    PLAN:    In order of problems listed above:  #Systolic Murmur #DOE: Patient with progressive dyspnea on exertion over the past 6 months found to have a loud holosystolic murmur on examination heard throughout the precordium. Findings concerning for symptomatic valvular disease (? MR vs possibly AS). No LE edema, orthopnea, PND, lightheadedness, chest pain or syncope. Will check TTE and pursue further work-up pending echo findings -Check TTE -If TTE without significant valve disease to explain symptoms, will proceed with ischemic work-up  #HTN: ECG with LVH and strain. Suspect elevated blood pressures at home. Not on antihypertensives currently. -Start valsartan 40mg  daily -BMET next week  #HLD: LDL above goal at 123 despite crestor 20mg  daily. -Start zetia 10mg  daily -Continue crestor 20mg  daily -Repeat lipids 6 weeks    Medication Adjustments/Labs and Tests Ordered: Current medicines are reviewed at length with the patient today.  Concerns regarding medicines are outlined above.  Orders Placed This Encounter  Procedures  .  Basic metabolic panel  . EKG 12-Lead  . ECHOCARDIOGRAM COMPLETE   Meds ordered this encounter  Medications  . ezetimibe (ZETIA) 10 MG tablet    Sig: Take 1 tablet (10 mg total) by mouth daily.    Dispense:  90 tablet    Refill:  3  . valsartan (DIOVAN) 40 MG tablet    Sig: Take 1 tablet (40 mg total) by mouth daily.    Dispense:  90 tablet    Refill:  3    Patient Instructions  Medication Instructions:  Start Zetia 10mg  daily Valsartan 40 mg daily  Your physician recommends that you continue on your current medications as directed. Please refer to the Current Medication list given to you today. *If you need a refill on your cardiac medications before your next appointment, please call your pharmacy*   Lab Work: BMET next week If  you have labs (blood work) drawn today and your tests are completely normal, you will receive your results only by: Marland Kitchen MyChart Message (if you have MyChart) OR . A paper copy in the mail If you have any lab test that is abnormal or we need to change your treatment, we will call you to review the results.   Testing/Procedures: ECHO ASAP Your physician has requested that you have an echocardiogram. Echocardiography is a painless test that uses sound waves to create images of your heart. It provides your doctor with information about the size and shape of your heart and how well your heart's chambers and valves are working. This procedure takes approximately one hour. There are no restrictions for this procedure.    Follow-Up: At [facility], you and your health needs are our priority.  As part of our continuing mission to provide you with exceptional heart care, we have created designated Provider Care Teams.  These Care Teams include your primary Cardiologist (physician) and Advanced Practice Providers (APPs -  Physician Assistants and Nurse Practitioners) who all work together to provide you with the care you need, when you need it.  We recommend  signing up for the patient portal called "MyChart".  Sign up information is provided on this After Visit Summary.  MyChart is used to connect with patients for Virtual Visits (Telemedicine).  Patients are able to view lab/test results, encounter notes, upcoming appointments, etc.  Non-urgent messages can be sent to your provider as well.   To learn more about what you can do with MyChart, go to [email or web address].    Your next appointment:   6 week(s) after ECHO  The format for your next appointment:   In Person  Provider:   You may see Dr. Gwyndolyn Kaufman or one of the following Advanced Practice Providers on your designated Care Team:    Richardson Dopp, PA-C  Robbie Lis, [place]         Signed, Freada Bergeron, MD  06/16/2020 1:16 PM    [place]

## 2020-06-16 ENCOUNTER — Encounter: Payer: Self-pay | Admitting: Cardiology

## 2020-06-16 ENCOUNTER — Other Ambulatory Visit: Payer: Self-pay

## 2020-06-16 ENCOUNTER — Ambulatory Visit (INDEPENDENT_AMBULATORY_CARE_PROVIDER_SITE_OTHER): Payer: Commercial Managed Care - PPO | Admitting: Cardiology

## 2020-06-16 VITALS — BP 136/100 | HR 73 | Ht 66.0 in | Wt 145.6 lb

## 2020-06-16 DIAGNOSIS — R011 Cardiac murmur, unspecified: Secondary | ICD-10-CM | POA: Diagnosis not present

## 2020-06-16 DIAGNOSIS — R0600 Dyspnea, unspecified: Secondary | ICD-10-CM

## 2020-06-16 DIAGNOSIS — E785 Hyperlipidemia, unspecified: Secondary | ICD-10-CM

## 2020-06-16 DIAGNOSIS — R0609 Other forms of dyspnea: Secondary | ICD-10-CM

## 2020-06-16 DIAGNOSIS — I10 Essential (primary) hypertension: Secondary | ICD-10-CM | POA: Diagnosis not present

## 2020-06-16 MED ORDER — EZETIMIBE 10 MG PO TABS: 10.0000 mg | ORAL_TABLET | Freq: Every day | ORAL | 3 refills | Status: DC

## 2020-06-16 MED ORDER — VALSARTAN 40 MG PO TABS: 40.0000 mg | ORAL_TABLET | Freq: Every day | ORAL | 3 refills | Status: DC

## 2020-06-16 NOTE — Patient Instructions (Signed)
Medication Instructions:  Start Zetia 10mg  daily Valsartan 40 mg daily  Your physician recommends that you continue on your current medications as directed. Please refer to the Current Medication list given to you today. *If you need a refill on your cardiac medications before your next appointment, please call your pharmacy*   Lab Work: BMET next week If you have labs (blood work) drawn today and your tests are completely normal, you will receive your results only by: Marland Kitchen MyChart Message (if you have MyChart) OR . A paper copy in the mail If you have any lab test that is abnormal or we need to change your treatment, we will call you to review the results.   Testing/Procedures: ECHO ASAP Your physician has requested that you have an echocardiogram. Echocardiography is a painless test that uses sound waves to create images of your heart. It provides your doctor with information about the size and shape of your heart and how well your heart's chambers and valves are working. This procedure takes approximately one hour. There are no restrictions for this procedure.    Follow-Up: At [facility], you and your health needs are our priority.  As part of our continuing mission to provide you with exceptional heart care, we have created designated Provider Care Teams.  These Care Teams include your primary Cardiologist (physician) and Advanced Practice Providers (APPs -  Physician Assistants and Nurse Practitioners) who all work together to provide you with the care you need, when you need it.  We recommend signing up for the patient portal called "MyChart".  Sign up information is provided on this After Visit Summary.  MyChart is used to connect with patients for Virtual Visits (Telemedicine).  Patients are able to view lab/test results, encounter notes, upcoming appointments, etc.  Non-urgent messages can be sent to your provider as well.   To learn more about what you can do with MyChart, go to  [email or web address].    Your next appointment:   6 week(s) after ECHO  The format for your next appointment:   In Person  Provider:   You may see Dr. Gwyndolyn Kaufman or one of the following Advanced Practice Providers on your designated Care Team:    Richardson Dopp, PA-C  [place], [place]

## 2020-06-19 ENCOUNTER — Other Ambulatory Visit: Payer: Self-pay

## 2020-06-19 ENCOUNTER — Ambulatory Visit (HOSPITAL_COMMUNITY): Payer: Commercial Managed Care - PPO | Attending: Internal Medicine

## 2020-06-19 DIAGNOSIS — R011 Cardiac murmur, unspecified: Secondary | ICD-10-CM

## 2020-06-19 LAB — ECHOCARDIOGRAM COMPLETE
AR max vel: 0.6 cm2
AV Area VTI: 0.6 cm2
AV Area mean vel: 0.61 cm2
AV Mean grad: 44.7 mmHg
AV Peak grad: 78.9 mmHg
Ao pk vel: 4.44 m/s
Area-P 1/2: 2.45 cm2
P 1/2 time: 507 msec
S' Lateral: 2.6 cm

## 2020-06-22 ENCOUNTER — Telehealth: Payer: Self-pay

## 2020-06-22 ENCOUNTER — Telehealth: Payer: Self-pay | Admitting: Cardiology

## 2020-06-22 DIAGNOSIS — I350 Nonrheumatic aortic (valve) stenosis: Secondary | ICD-10-CM

## 2020-06-22 NOTE — Telephone Encounter (Signed)
RN placed a referral for structural team per Dr. Gwyndolyn Kaufman. Will send a message to the structural team. Thanks Manuela Schwartz RN

## 2020-06-22 NOTE — Telephone Encounter (Signed)
Left message for patient to call office. Manuela Schwartz RN

## 2020-06-22 NOTE — Telephone Encounter (Signed)
Patient spoke with Dr. Johney Frame regarding his echo results. His wife is calling back to get clarification. She states her husband is kind of having a panic attack because he was told he was going to need to have open heart surgery. She would just like more clarification about what is going on.

## 2020-06-22 NOTE — Telephone Encounter (Signed)
Called and spoke to the patient again. He understands and is just overwhelmed by the process and the diagnosis. We will continue to set him up for cath and refer to surgery team. He understands and states he is amenable to this plan.  Gwyndolyn Kaufman, MD

## 2020-06-22 NOTE — Telephone Encounter (Signed)
-----   Message from Freada Bergeron, MD sent at 06/22/2020  1:39 PM EDT ----- Can we get him set up for cath [facility]) with either [place] or [place] and then refer to CV surgery for consideration of AVR for severe, symptomatic AS. The patient is anxious about the diagnosis but understands we are working to make him feel better. I have called and spoke to him twice so he should be expecting this phone call.  Thanks so much!  -Nira Conn

## 2020-06-22 NOTE — Telephone Encounter (Signed)
-----   Message from Freada Bergeron, MD sent at 06/22/2020 11:09 AM EDT ----- Called and spoke to the patient about his echo results. He has severe, symptomatic AS. Can we get him in with structural team? He knows that he is going to need cath and surgical evaluation.

## 2020-06-23 NOTE — Telephone Encounter (Signed)
Called patient again. Tried to set patient up to get heart cath and referral to Cardiac Surgeons. Patient stated he wanted to see someone to discuss all this before he does any procedure. Informed patient that Dr. Johney Frame has already talked to him about this. Patient stated he still does not understand. Made patient an appointment with Dr. Johney Frame, next available to discuss heart cath and referral. Patient verbalized understanding.

## 2020-06-23 NOTE — Telephone Encounter (Addendum)
Patient called back and would like to go ahead and schedule heart cath. Scheduled patient for heart cath on 07/01/20 at 8:30 am, and to arrive at 6:30 am. Patient coming into office tomorrow for lab work. Went over cath instructions with patient, will leave him a copy at front desk to pick up tomorrow when he comes in for lab work.

## 2020-06-23 NOTE — Addendum Note (Signed)
Addended by: Aris Georgia, Lorik Guo L on: 06/23/2020 01:07 PM   Modules accepted: Orders

## 2020-06-23 NOTE — Telephone Encounter (Signed)
No problem. Happy to see him and discuss further.

## 2020-06-24 ENCOUNTER — Other Ambulatory Visit: Payer: Self-pay

## 2020-06-24 ENCOUNTER — Other Ambulatory Visit: Payer: Commercial Managed Care - PPO | Admitting: *Deleted

## 2020-06-24 DIAGNOSIS — I350 Nonrheumatic aortic (valve) stenosis: Secondary | ICD-10-CM

## 2020-06-24 DIAGNOSIS — R011 Cardiac murmur, unspecified: Secondary | ICD-10-CM

## 2020-06-24 LAB — CBC
Hematocrit: 42.1 % (ref 37.5–51.0)
Hemoglobin: 14.7 g/dL (ref 13.0–17.7)
MCH: 32.7 pg (ref 26.6–33.0)
MCHC: 34.9 g/dL (ref 31.5–35.7)
MCV: 94 fL (ref 79–97)
Platelets: 158 10*3/uL (ref 150–450)
RBC: 4.49 x10E6/uL (ref 4.14–5.80)
RDW: 12.4 % (ref 11.6–15.4)
WBC: 6.1 10*3/uL (ref 3.4–10.8)

## 2020-06-24 LAB — BASIC METABOLIC PANEL
BUN/Creatinine Ratio: 10 (ref 9–20)
BUN: 10 mg/dL (ref 6–24)
CO2: 23 mmol/L (ref 20–29)
Calcium: 9.4 mg/dL (ref 8.7–10.2)
Chloride: 101 mmol/L (ref 96–106)
Creatinine, Ser: 1.05 mg/dL (ref 0.76–1.27)
Glucose: 127 mg/dL — ABNORMAL HIGH (ref 65–99)
Potassium: 4.5 mmol/L (ref 3.5–5.2)
Sodium: 139 mmol/L (ref 134–144)
eGFR: 84 mL/min/{1.73_m2} (ref >59)

## 2020-06-29 ENCOUNTER — Other Ambulatory Visit (HOSPITAL_COMMUNITY)
Admission: RE | Admit: 2020-06-29 | Discharge: 2020-06-29 | Disposition: A | Discharge status: Home / Self Care | Payer: Commercial Managed Care - PPO | Source: Ambulatory Visit | Attending: Cardiovascular Disease | Admitting: Cardiovascular Disease

## 2020-06-29 DIAGNOSIS — Z01812 Encounter for preprocedural laboratory examination: Secondary | ICD-10-CM | POA: Diagnosis not present

## 2020-06-29 DIAGNOSIS — Z20822 Contact with and (suspected) exposure to covid-19: Secondary | ICD-10-CM | POA: Diagnosis not present

## 2020-06-30 ENCOUNTER — Telehealth: Payer: Self-pay | Admitting: *Deleted

## 2020-06-30 LAB — SARS CORONAVIRUS 2 (TAT 6-24 HRS): SARS Coronavirus 2: NEGATIVE

## 2020-06-30 NOTE — Telephone Encounter (Signed)
Pt contacted pre-catheterization scheduled at [facility] for: Wednesday July 01, 2020 8:30 AM Verified arrival time and place: [facility]) at: 6:30 AM   No solid food after midnight prior to cath, clear liquids until 5 AM day of procedure.   AM meds can be  taken pre-cath with sips of water including: ASA 81 mg   Confirmed patient has responsible adult to drive home post procedure and be with patient first 24 hours after arriving home: yes  You are allowed ONE visitor in the waiting room during the time you are at the hospital for your procedure. Both you and your visitor must wear a mask once you enter the hospital.  Reviewed procedure/mask/visitor instructions with patient.

## 2020-07-01 ENCOUNTER — Encounter (HOSPITAL_COMMUNITY)
Admission: RE | Disposition: A | Discharge status: Home / Self Care | Payer: Self-pay | Source: Home / Self Care | Attending: Cardiovascular Disease

## 2020-07-01 ENCOUNTER — Ambulatory Visit (HOSPITAL_COMMUNITY)
Admission: RE | Admit: 2020-07-01 | Discharge: 2020-07-01 | Disposition: A | Discharge status: Home / Self Care | Payer: Commercial Managed Care - PPO | Attending: Cardiovascular Disease | Admitting: Cardiovascular Disease

## 2020-07-01 ENCOUNTER — Encounter (HOSPITAL_COMMUNITY): Payer: Self-pay | Admitting: Cardiovascular Disease

## 2020-07-01 DIAGNOSIS — I10 Essential (primary) hypertension: Secondary | ICD-10-CM | POA: Insufficient documentation

## 2020-07-01 DIAGNOSIS — I350 Nonrheumatic aortic (valve) stenosis: Secondary | ICD-10-CM | POA: Diagnosis present

## 2020-07-01 DIAGNOSIS — E785 Hyperlipidemia, unspecified: Secondary | ICD-10-CM | POA: Insufficient documentation

## 2020-07-01 DIAGNOSIS — R0609 Other forms of dyspnea: Secondary | ICD-10-CM | POA: Diagnosis not present

## 2020-07-01 DIAGNOSIS — Z885 Allergy status to narcotic agent status: Secondary | ICD-10-CM | POA: Diagnosis not present

## 2020-07-01 DIAGNOSIS — Z79899 Other long term (current) drug therapy: Secondary | ICD-10-CM | POA: Diagnosis not present

## 2020-07-01 HISTORY — PX: RIGHT/LEFT HEART CATH AND CORONARY ANGIOGRAPHY: CATH118266

## 2020-07-01 LAB — POCT I-STAT 7, (LYTES, BLD GAS, ICA,H+H)
Acid-Base Excess: 0 mmol/L (ref 0.0–2.0)
Bicarbonate: 25.7 mmol/L (ref 20.0–28.0)
Calcium, Ion: 1.28 mmol/L (ref 1.15–1.40)
HCT: 41 % (ref 39.0–52.0)
Hemoglobin: 13.9 g/dL (ref 13.0–17.0)
O2 Saturation: 99 %
Potassium: 4.1 mmol/L (ref 3.5–5.1)
Sodium: 142 mmol/L (ref 135–145)
TCO2: 27 mmol/L (ref 22–32)
pCO2 arterial: 45.2 mmHg (ref 32.0–48.0)
pH, Arterial: 7.364 (ref 7.350–7.450)
pO2, Arterial: 150 mmHg — ABNORMAL HIGH (ref 83.0–108.0)

## 2020-07-01 LAB — POCT I-STAT EG7
Acid-Base Excess: 0 mmol/L (ref 0.0–2.0)
Bicarbonate: 26.6 mmol/L (ref 20.0–28.0)
Calcium, Ion: 1.3 mmol/L (ref 1.15–1.40)
HCT: 41 % (ref 39.0–52.0)
Hemoglobin: 13.9 g/dL (ref 13.0–17.0)
O2 Saturation: 77 %
Potassium: 4.2 mmol/L (ref 3.5–5.1)
Sodium: 143 mmol/L (ref 135–145)
TCO2: 28 mmol/L (ref 22–32)
pCO2, Ven: 48.8 mmHg (ref 44.0–60.0)
pH, Ven: 7.345 (ref 7.250–7.430)
pO2, Ven: 44 mmHg (ref 32.0–45.0)

## 2020-07-01 SURGERY — RIGHT/LEFT HEART CATH AND CORONARY ANGIOGRAPHY
Anesthesia: LOCAL

## 2020-07-01 MED ORDER — MIDAZOLAM HCL 2 MG/2ML IJ SOLN
INTRAMUSCULAR | Status: DC | PRN
  Administered 2020-07-01: 2 mg via INTRAVENOUS

## 2020-07-01 MED ORDER — SODIUM CHLORIDE 0.9% FLUSH: 3.0000 mL | INTRAVENOUS | Status: DC | PRN

## 2020-07-01 MED ORDER — HYDRALAZINE HCL 20 MG/ML IJ SOLN: 10.0000 mg | INTRAMUSCULAR | Status: DC | PRN

## 2020-07-01 MED ORDER — LIDOCAINE HCL (PF) 1 % IJ SOLN
INTRAMUSCULAR | Status: DC | PRN
  Administered 2020-07-01: 5 mL

## 2020-07-01 MED ORDER — LABETALOL HCL 5 MG/ML IV SOLN: 10.0000 mg | INTRAVENOUS | Status: DC | PRN

## 2020-07-01 MED ORDER — ONDANSETRON HCL 4 MG/2ML IJ SOLN: 4.0000 mg | Freq: Four times a day (QID) | INTRAMUSCULAR | Status: DC | PRN

## 2020-07-01 MED ORDER — SODIUM CHLORIDE 0.9 % IV SOLN: INTRAVENOUS | Status: AC

## 2020-07-01 MED ORDER — SODIUM CHLORIDE 0.9% FLUSH: 3.0000 mL | Freq: Two times a day (BID) | INTRAVENOUS | Status: DC

## 2020-07-01 MED ORDER — SODIUM CHLORIDE 0.9 % WEIGHT BASED INFUSION: 1.0000 mL/kg/h | INTRAVENOUS | Status: DC

## 2020-07-01 MED ORDER — SODIUM CHLORIDE 0.9 % IV SOLN: 250.0000 mL | INTRAVENOUS | Status: DC | PRN

## 2020-07-01 MED ORDER — HEPARIN (PORCINE) IN NACL 1000-0.9 UT/500ML-% IV SOLN
INTRAVENOUS | Status: AC
  Filled 2020-07-01: qty 1000

## 2020-07-01 MED ORDER — FENTANYL CITRATE (PF) 100 MCG/2ML IJ SOLN
INTRAMUSCULAR | Status: DC | PRN
  Administered 2020-07-01: 50 ug via INTRAVENOUS

## 2020-07-01 MED ORDER — IOHEXOL 350 MG/ML SOLN
INTRAVENOUS | Status: DC | PRN
  Administered 2020-07-01: 40 mL

## 2020-07-01 MED ORDER — FENTANYL CITRATE (PF) 100 MCG/2ML IJ SOLN
INTRAMUSCULAR | Status: AC
  Filled 2020-07-01: qty 2

## 2020-07-01 MED ORDER — SODIUM CHLORIDE 0.9 % WEIGHT BASED INFUSION
3.0000 mL/kg/h | INTRAVENOUS | Status: AC
  Administered 2020-07-01: 3 mL/kg/h via INTRAVENOUS

## 2020-07-01 MED ORDER — ASPIRIN 81 MG PO CHEW: 81.0000 mg | CHEWABLE_TABLET | ORAL | Status: DC

## 2020-07-01 MED ORDER — HEPARIN (PORCINE) IN NACL 1000-0.9 UT/500ML-% IV SOLN
INTRAVENOUS | Status: DC | PRN
  Administered 2020-07-01 (×2): 500 mL

## 2020-07-01 MED ORDER — HEPARIN SODIUM (PORCINE) 1000 UNIT/ML IJ SOLN
INTRAMUSCULAR | Status: AC
  Filled 2020-07-01: qty 1

## 2020-07-01 MED ORDER — VERAPAMIL HCL 2.5 MG/ML IV SOLN
INTRAVENOUS | Status: DC | PRN
  Administered 2020-07-01: 10 mL via INTRA_ARTERIAL

## 2020-07-01 MED ORDER — LIDOCAINE HCL (PF) 1 % IJ SOLN
INTRAMUSCULAR | Status: AC
  Filled 2020-07-01: qty 30

## 2020-07-01 MED ORDER — HEPARIN SODIUM (PORCINE) 1000 UNIT/ML IJ SOLN
INTRAMUSCULAR | Status: DC | PRN
  Administered 2020-07-01: 3500 [IU] via INTRAVENOUS

## 2020-07-01 MED ORDER — MIDAZOLAM HCL 2 MG/2ML IJ SOLN
INTRAMUSCULAR | Status: AC
  Filled 2020-07-01: qty 2

## 2020-07-01 MED ORDER — VERAPAMIL HCL 2.5 MG/ML IV SOLN
INTRAVENOUS | Status: AC
  Filled 2020-07-01: qty 2

## 2020-07-01 SURGICAL SUPPLY — 15 items
CATH 5FR JL3.5 JR4 ANG PIG MP (CATHETERS) ×1 IMPLANT
CATH BALLN WEDGE 5F 110CM (CATHETERS) ×1 IMPLANT
CATH INFINITI 5FR AL1 (CATHETERS) ×1 IMPLANT
DEVICE RAD COMP TR BAND LRG (VASCULAR PRODUCTS) ×1 IMPLANT
GLIDESHEATH SLEND SS 6F .021 (SHEATH) ×1 IMPLANT
GUIDEWIRE .025 260CM (WIRE) ×1 IMPLANT
GUIDEWIRE INQWIRE 1.5J.035X260 (WIRE) ×1 IMPLANT
INQWIRE 1.5J .035X260CM (WIRE) ×2
KIT HEART LEFT (KITS) ×2 IMPLANT
PACK CARDIAC CATHETERIZATION (CUSTOM PROCEDURE TRAY) ×2 IMPLANT
SHEATH GLIDE SLENDER 4/5FR (SHEATH) ×1 IMPLANT
SHEATH PROBE COVER 6X72 (BAG) ×1 IMPLANT
TRANSDUCER W/STOPCOCK (MISCELLANEOUS) ×2 IMPLANT
TUBING CIL FLEX 10 FLL-RA (TUBING) ×2 IMPLANT
WIRE EMERALD ST .035X150CM (WIRE) ×1 IMPLANT

## 2020-07-01 NOTE — Discharge Instructions (Signed)
Radial Site Care  This sheet gives you information about how to care for yourself after your procedure. Your health care provider may also give you more specific instructions. If you have problems or questions, contact your health care provider. What can I expect after the procedure? After the procedure, it is common to have:  Bruising and tenderness at the catheter insertion area. Follow these instructions at home: Medicines  Take over-the-counter and prescription medicines only as told by your health care provider. Insertion site care 1. Follow instructions from your health care provider about how to take care of your insertion site. Make sure you: ? Wash your hands with soap and water before you remove your bandage (dressing). If soap and water are not available, use hand sanitizer. ? May remove dressing in 24 hours. 2. Check your insertion site every day for signs of infection. Check for: ? Redness, swelling, or pain. ? Fluid or blood. ? Pus or a bad smell. ? Warmth. 3. Do no take baths, swim, or use a hot tub for 5 days. 4. You may shower 24-48 hours after the procedure. ? Remove the dressing and gently wash the site with plain soap and water. ? Pat the area dry with a clean towel. ? Do not rub the site. That could cause bleeding. 5. Do not apply powder or lotion to the site. Activity  1. For 24 hours after the procedure, or as directed by your health care provider: ? Do not flex or bend the affected arm. ? Do not push or pull heavy objects with the affected arm. ? Do not drive yourself home from the hospital or clinic. You may drive 24 hours after the procedure. ? Do not operate machinery or power tools. ? KEEP ARM ELEVATED THE REMAINDER OF THE DAY. 2. Do not push, pull or lift anything that is heavier than 10 lb for 5 days. 3. Ask your health care provider when it is okay to: ? Return to work or school. ? Resume usual physical activities or sports. ? Resume sexual  activity. General instructions  If the catheter site starts to bleed, raise your arm and put firm pressure on the site. If the bleeding does not stop, get help right away. This is a medical emergency.  DRINK PLENTY OF FLUIDS FOR THE NEXT 2-3 DAYS.  No alcohol consumption for 24 hours after receiving sedation.  If you went home on the same day as your procedure, a responsible adult should be with you for the first 24 hours after you arrive home.  Keep all follow-up visits as told by your health care provider. This is important. Contact a health care provider if:  You have a fever.  You have redness, swelling, or yellow drainage around your insertion site. Get help right away if:  You have unusual pain at the radial site.  The catheter insertion area swells very fast.  The insertion area is bleeding, and the bleeding does not stop when you hold steady pressure on the area.  Your arm or hand becomes pale, cool, tingly, or numb. These symptoms may represent a serious problem that is an emergency. Do not wait to see if the symptoms will go away. Get medical help right away. Call your local emergency services (911 in the [place] Do not drive yourself to the hospital. Summary  After the procedure, it is common to have bruising and tenderness at the site.  Follow instructions from your health care provider about how to take care   of your radial site wound. Check the wound every day for signs of infection.  This information is not intended to replace advice given to you by your health care provider. Make sure you discuss any questions you have with your health care provider. Document Revised: 05/03/2017 Document Reviewed: 05/03/2017 Elsevier Patient Education  2020 [employer] 

## 2020-07-01 NOTE — Interval H&P Note (Signed)
History and Physical Interval Note:  07/01/2020 7:59 AM  William Welch  has presented today for surgery, with the diagnosis of severe aortic stenosis.  The various methods of treatment have been discussed with the patient and family. After consideration of risks, benefits and other options for treatment, the patient has consented to  Procedure(s): RIGHT/LEFT HEART CATH AND CORONARY ANGIOGRAPHY (N/A) as a surgical intervention.  The patient's history has been reviewed, patient examined, no change in status, stable for surgery.  I have reviewed the patient's chart and labs.  Questions were answered to the patient's satisfaction.   Cath Lab Visit (complete for each Cath Lab visit)  Clinical Evaluation Leading to the Procedure:   ACS: No.  Non-ACS:    Anginal Classification: No Symptoms  Anti-ischemic medical therapy: No Therapy  Non-Invasive Test Results: No non-invasive testing performed  Prior CABG: No previous CABG        Lauree Chandler

## 2020-07-01 NOTE — Research (Signed)
[facility] Informed Consent   Subject Name: William Welch  Subject met inclusion and exclusion criteria.  The informed consent form, study requirements and expectations were reviewed with the subject and questions and concerns were addressed prior to the signing of the consent form.  The subject verbalized understanding of the trail requirements.  The subject agreed to participate in the [facility] trial and signed the informed consent.  The informed consent was obtained prior to performance of any protocol-specific procedures for the subject.  A copy of the signed informed consent was given to the subject and a copy was placed in the subject's medical record.  [name]. 07/01/2020, 06:55 am

## 2020-07-05 NOTE — Progress Notes (Signed)
Cardiology Office Note:    Date:  07/07/2020   ID:  William Welch, DOB 1965/04/19, MRN [id number]  PCP:  Leanna Battles, [facility]  Cardiologist:  Freada Bergeron, MD  Advanced Practice Provider:  No care team member to display Electrophysiologist:  None   Referring MD: Leanna Battles, MD    History of Present Illness:    William Welch is a 55 y.o. male with a hx of HTN, HLD, GERD and recent diagnosis of severe aortic stenosis who returns to clinic for follow-up.  Last seen on 06/16/20 where he was referred by his PCP for worsening DOE and a loud systolic murmur noted on examination. TTE obtained which showed AVA 0.6, mean gradient 70mmHg, Vmax 4.44 m/s. Was referred for coronary angiography which revealed mild, non-obstructive CAD. Now referred to CV surgery for consideration of AVR.  Today, the patient feels well. No chest pain, SOB, DOE, LE edema. Planned to see Dr. Roxy Manns for discussion of AVR after this appointment.   Past Medical History:  Diagnosis Date  . Arthritis   . GERD (gastroesophageal reflux disease)   . Gout   . Heart murmur   . Hyperlipidemia   . Hypertension     Past Surgical History:  Procedure Laterality Date  . FACIAL RECONSTRUCTION SURGERY    . JOINT REPLACEMENT    . RIGHT/LEFT HEART CATH AND CORONARY ANGIOGRAPHY N/A 07/01/2020   Procedure: RIGHT/LEFT HEART CATH AND CORONARY ANGIOGRAPHY;  Surgeon: Burnell Blanks, MD;  Location: [facility];  Service: Cardiovascular;  Laterality: N/A;    Current Medications: Current Meds  Medication Sig  . allopurinol (ZYLOPRIM) 300 MG tablet Take 300 mg by mouth daily.  . colchicine 0.6 MG tablet Take 0.6 mg by mouth daily as needed (Gout).  Marland Kitchen ezetimibe (ZETIA) 10 MG tablet Take 1 tablet (10 mg total) by mouth daily.  Marland Kitchen omeprazole (PRILOSEC) 20 MG capsule Take 20 mg by mouth daily.  . rosuvastatin (CRESTOR) 20 MG tablet Take 20 mg by mouth daily.  . valsartan  (DIOVAN) 40 MG tablet Take 1 tablet (40 mg total) by mouth daily.     Allergies:   Percocet [oxycodone-acetaminophen]   Social History   Socioeconomic History  . Marital status: Married    Spouse name: Not on file  . Number of children: Not on file  . Years of education: Not on file  . Highest education level: Not on file  Occupational History  . Not on file  Tobacco Use  . Smoking status: Never Smoker  . Smokeless tobacco: Never Used  Substance and Sexual Activity  . Alcohol use: Yes    Comment: drinks 5 or 6 beers daily.  . Drug use: Not Currently  . Sexual activity: Not on file  Other Topics Concern  . Not on file  Social History Narrative  . Not on file   Social Determinants of Health   Financial Resource Strain: Not on file  Food Insecurity: Not on file  Transportation Needs: Not on file  Physical Activity: Not on file  Stress: Not on file  Social Connections: Not on file     Family History: The patient's family history is negative for Colon cancer, Esophageal cancer, Rectal cancer, and Stomach cancer.  ROS:   Please see the history of present illness.    Review of Systems  Constitutional: Negative for chills, diaphoresis and fever.  HENT: Negative for hearing loss.   Eyes: Negative for  blurred vision and redness.  Respiratory: Negative for shortness of breath.   Cardiovascular: Negative for chest pain, palpitations, orthopnea, claudication, leg swelling and PND.  Gastrointestinal: Negative for melena, nausea and vomiting.  Genitourinary: Negative for dysuria and flank pain.  Musculoskeletal: Negative for falls.  Neurological: Negative for dizziness and loss of consciousness.  Endo/Heme/Allergies: Negative for polydipsia.  Psychiatric/Behavioral: Negative for substance abuse.    EKGs/Labs/Other Studies Reviewed:    The following studies were reviewed today: TTE Jun 23, 2020: IMPRESSIONS  1. The aortic valve is calcified. Aortic valve regurgitation is  mild.  Severe aortic valve stenosis. Aortic valve area, by VTI measures 0.60 cm.  Aortic valve mean gradient measures 44.7 mmHg. Aortic valve Vmax measures  4.44 m/s. Through morphologically  unclear, given demographic data, diagnosis of biscuspid aortic valve  should be considered.  2. Left ventricular ejection fraction, by estimation, is 55 to 60%. The  left ventricle has normal function. The left ventricle has no regional  wall motion abnormalities. There is mild concentric left ventricular  hypertrophy. Left ventricular diastolic  parameters are consistent with Grade I diastolic dysfunction (impaired  relaxation).  3. Right ventricular systolic function is normal. The right ventricular  size is normal.  4. Left atrial size was mildly dilated.  5. The mitral valve is normal in structure. No evidence of mitral valve  regurgitation. No evidence of mitral stenosis.  6. The inferior vena cava is normal in size with greater than 50%  respiratory variability, suggesting right atrial pressure of 3 mmHg.  LHC 07/01/20:  Ost RCA to Prox RCA lesion is 20% stenosed.  Prox RCA to Mid RCA lesion is 20% stenosed.  Mid LAD lesion is 20% stenosed.   Mild non-obstructive CAD Severe aortic stenosis (mean gradient by cath 37.5 mmHg, peak to peak gradient 54 mmHg, AVA 0.77cm2).   Recommendations: Will continue planning for AVR. He would be a candidate for surgical AVR or TAVR but given his young age, he may be best treated with conventional surgical AVR. Will make a referral to our CT surgeons on the valve team.    Recent Labs: 06/24/2020: BUN 10; Creatinine, Ser 1.05; Platelets 158 07/01/2020: Hemoglobin 13.9; Potassium 4.1; Sodium 142  Recent Lipid Panel No results found for: CHOL, TRIG, HDL, CHOLHDL, VLDL, LDLCALC, LDLDIRECT   Risk Assessment/Calculations:       Physical Exam:    VS:  BP 120/80   Pulse 83   Ht 5\' 6"  (1.676 m)   Wt 147 lb (66.7 kg)   SpO2 96%   BMI 23.73  kg/m     Wt Readings from Last 3 Encounters:  07/07/20 147 lb (66.7 kg)  07/01/20 146 lb (66.2 kg)  06/16/20 145 lb 9.6 oz (66 kg)     GEN:  Well nourished, well developed in no acute distress HEENT: Normal NECK: No JVD; No carotid bruits CARDIAC: RRR, 3/6 harsh, systolic murmur heard throughout the precordium. No rubs, gallops RESPIRATORY:  Clear to auscultation without rales, wheezing or rhonchi  ABDOMEN: Soft, non-tender, non-distended MUSCULOSKELETAL:  No edema; No deformity  SKIN: Warm and dry NEUROLOGIC:  Alert and oriented x 3 PSYCHIATRIC:  Normal affect   ASSESSMENT:    1. Severe aortic stenosis   2. Hypertension, unspecified type   3. Hyperlipidemia, unspecified hyperlipidemia type    PLAN:    In order of problems listed above:  #Severe Aortic Stenosis: Newly diagnosed when he presented to clinic with worsening DOE and a systolic murmur. AVA 0.6, mean gradient 62mmHg,  Vmax 4.33m/s. Cath without obstructive disease. Now referred for AVR. -Referred to CV surgery for consideration of AVR  #HTN: ECG with LVH and strain. Suspect elevated blood pressures at home. Not on antihypertensives currently. -Start valsartan 40mg  daily -BMET next week  #HLD: LDL above goal at 123 despite crestor 20mg  daily. -Continue zetia 10mg  daily -Continue crestor 20mg  daily -Repeat lipids 6 weeks   Medication Adjustments/Labs and Tests Ordered: Current medicines are reviewed at length with the patient today.  Concerns regarding medicines are outlined above.  No orders of the defined types were placed in this encounter.  No orders of the defined types were placed in this encounter.   Patient Instructions  Medication Instructions:  Your physician recommends that you continue on your current medications as directed. Please refer to the Current Medication list given to you today.  *If you need a refill on your cardiac medications before your next appointment, please call your  pharmacy*  Follow-Up: At [facility], you and your health needs are our priority.  As part of our continuing mission to provide you with exceptional heart care, we have created designated Provider Care Teams.  These Care Teams include your primary Cardiologist (physician) and Advanced Practice Providers (APPs -  Physician Assistants and Nurse Practitioners) who all work together to provide you with the care you need, when you need it.  Your next appointment:   4 month(s)  The format for your next appointment:   In Person  Provider:   You may see Freada Bergeron, MD or one of the following Advanced Practice Providers on your designated Care Team:    Richardson Dopp, PA-C  Robbie Lis, [place]       Signed, Freada Bergeron, MD  07/07/2020 10:13 AM    [place]

## 2020-07-07 ENCOUNTER — Institutional Professional Consult (permissible substitution): Payer: Commercial Managed Care - PPO | Admitting: Thoracic Surgery (Cardiothoracic Vascular Surgery)

## 2020-07-07 ENCOUNTER — Encounter: Payer: Self-pay | Admitting: *Deleted

## 2020-07-07 ENCOUNTER — Other Ambulatory Visit: Payer: Self-pay | Admitting: *Deleted

## 2020-07-07 ENCOUNTER — Other Ambulatory Visit: Payer: Self-pay

## 2020-07-07 ENCOUNTER — Encounter: Payer: Self-pay | Admitting: Thoracic Surgery (Cardiothoracic Vascular Surgery)

## 2020-07-07 ENCOUNTER — Ambulatory Visit: Payer: Commercial Managed Care - PPO | Admitting: Cardiology

## 2020-07-07 VITALS — BP 136/90 | HR 77 | Resp 20 | Ht 66.0 in | Wt 147.0 lb

## 2020-07-07 VITALS — BP 120/80 | HR 83 | Ht 66.0 in | Wt 147.0 lb

## 2020-07-07 DIAGNOSIS — E785 Hyperlipidemia, unspecified: Secondary | ICD-10-CM | POA: Diagnosis not present

## 2020-07-07 DIAGNOSIS — I350 Nonrheumatic aortic (valve) stenosis: Secondary | ICD-10-CM

## 2020-07-07 DIAGNOSIS — Z01818 Encounter for other preprocedural examination: Secondary | ICD-10-CM

## 2020-07-07 DIAGNOSIS — I10 Essential (primary) hypertension: Secondary | ICD-10-CM | POA: Diagnosis not present

## 2020-07-07 NOTE — Progress Notes (Signed)
[facility]  MULTIDISCIPLINARY HEART VALVE CLINIC  CARDIOTHORACIC SURGERY CONSULTATION REPORT  Referring Provider is Freada Bergeron, MD PCP is Leanna Battles, MD  Chief Complaint  Patient presents with  . Aortic Stenosis    Initial surgical consult, ECHO 3/11, TEE 3/16, Cath 3/23    HPI:  [name] with history of GE reflux disease, gout, hypertension, hyperlipidemia, and known history of heart murmur who has been referred for surgical consultation for management of recently discovered severe symptomatic aortic stenosis.  Patient states that he was told he had a heart murmur many years ago.  He never had it formally evaluated until recently.  Over the past 2 to 3 months he has developed symptoms of exertional shortness of breath and chest tightness.  He was seen by his primary care physician who noted a prominent systolic murmur on physical exam.  He was referred for cardiology evaluation and was seen by Dr. Johney Frame on June 16, 2020.  Echocardiogram performed June 19, 2020 revealed severe aortic stenosis with normal left ventricular systolic function.  The patient was referred to the multidisciplinary heart valve clinic and underwent diagnostic cardiac catheterization by Dr. Angelena Form on July 01, 2020.  Catheterization confirmed the presence of severe aortic stenosis with peak to peak and mean transvalvular gradients measured 54 and 37.5 mmHg, respectively, corresponding to aortic valve area calculated 0.77 cm.  Right heart pressures were normal and the patient did not have significant coronary artery disease.  Patient was referred for surgical consultation.  Patient is married and lives locally in [place] with his wife.  They have 2 young adult sons.  The patient works full-time as a Special educational needs teacher firm.  He has been otherwise healthy throughout his life and reports no physical limitations until the recent development of symptoms of  exertional shortness of breath and chest tightness.  Symptoms of shortness of breath and chest discomfort only occur with more strenuous physical exertion and do not occur with ordinary low-level activity.  The patient has never had any resting shortness of breath or chest discomfort.  He denies any history of PND, orthopnea, or lower extremity edema.  He has not had any palpitations, dizzy spells, nor syncope.  Past Medical History:  Diagnosis Date  . Arthritis   . GERD (gastroesophageal reflux disease)   . Gout   . Heart murmur   . Hyperlipidemia   . Hypertension     Past Surgical History:  Procedure Laterality Date  . FACIAL RECONSTRUCTION SURGERY    . JOINT REPLACEMENT    . RIGHT/LEFT HEART CATH AND CORONARY ANGIOGRAPHY N/A 07/01/2020   Procedure: RIGHT/LEFT HEART CATH AND CORONARY ANGIOGRAPHY;  Surgeon: Burnell Blanks, MD;  Location: [facility];  Service: Cardiovascular;  Laterality: N/A;    Family History  Problem Relation Age of Onset  . Colon cancer Neg Hx   . Esophageal cancer Neg Hx   . Rectal cancer Neg Hx   . Stomach cancer Neg Hx     Social History   Socioeconomic History  . Marital status: Married    Spouse name: Not on file  . Number of children: Not on file  . Years of education: Not on file  . Highest education level: Not on file  Occupational History  . Not on file  Tobacco Use  . Smoking status: Never Smoker  . Smokeless tobacco: Never Used  Substance and Sexual Activity  . Alcohol use: Yes    Comment:  drinks 5 or 6 beers daily.  . Drug use: Not Currently  . Sexual activity: Not on file  Other Topics Concern  . Not on file  Social History Narrative  . Not on file   Social Determinants of Health   Financial Resource Strain: Not on file  Food Insecurity: Not on file  Transportation Needs: Not on file  Physical Activity: Not on file  Stress: Not on file  Social Connections: Not on file  Intimate Partner Violence: Not on file     Current Outpatient Medications  Medication Sig Dispense Refill  . allopurinol (ZYLOPRIM) 300 MG tablet Take 300 mg by mouth daily.    . colchicine 0.6 MG tablet Take 0.6 mg by mouth daily as needed (Gout).    Marland Kitchen ezetimibe (ZETIA) 10 MG tablet Take 1 tablet (10 mg total) by mouth daily. 90 tablet 3  . omeprazole (PRILOSEC) 20 MG capsule Take 20 mg by mouth daily.    . rosuvastatin (CRESTOR) 20 MG tablet Take 20 mg by mouth daily.    . valsartan (DIOVAN) 40 MG tablet Take 1 tablet (40 mg total) by mouth daily. 90 tablet 3   No current facility-administered medications for this visit.    Allergies  Allergen Reactions  . Percocet [Oxycodone-Acetaminophen] Hives      Review of Systems:   General:  normal appetite, decreased energy, no weight gain, no weight loss, no fever  Cardiac:  + chest pain with exertion, no chest pain at rest, +SOB with exertion, no resting SOB, no PND, no orthopnea, no palpitations, no arrhythmia, no atrial fibrillation, no LE edema, no dizzy spells, no syncope  Respiratory:  no shortness of breath, no home oxygen, no productive cough, no dry cough, no bronchitis, no wheezing, no hemoptysis, no asthma, no pain with inspiration or cough, no sleep apnea, no CPAP at night  GI:   no difficulty swallowing, + reflux, + frequent heartburn, no hiatal hernia, no abdominal pain, no constipation, no diarrhea, no hematochezia, no hematemesis, no melena  GU:   no dysuria,  no frequency, no urinary tract infection, no hematuria, no enlarged prostate, no kidney stones, no kidney disease  Vascular:  no pain suggestive of claudication, no pain in feet, no leg cramps, no varicose veins, no DVT, no non-healing foot ulcer  Neuro:   no stroke, no TIA's, no seizures, no headaches, no temporary blindness one eye,  no slurred speech, no peripheral neuropathy, no chronic pain, no instability of gait, no memory/cognitive dysfunction  Musculoskeletal: + arthritis, no joint swelling, no  myalgias, no difficulty walking, normal mobility   Skin:   no rash, no itching, no skin infections, no pressure sores or ulcerations  Psych:   no anxiety, no depression, no nervousness, no unusual recent stress  Eyes:   no blurry vision, no floaters, no recent vision changes,  wears glasses only for reading  ENT:   no hearing loss, no loose or painful teeth, no dentures, last saw dentist > 2 years ago  Hematologic:  no easy bruising, no abnormal bleeding, no clotting disorder, no frequent epistaxis  Endocrine:  no diabetes, does not check CBG's at home           Physical Exam:   BP 136/90 (BP Location: Right Arm, Patient Position: Sitting)   Pulse 77   Resp 20   Ht 5\' 6"  (1.676 m)   Wt 147 lb (66.7 kg)   SpO2 99% Comment: RA  BMI 23.73 kg/m   General:  well-appearing  HEENT:  Unremarkable   Neck:   no JVD, no bruits, no adenopathy   Chest:   clear to auscultation, symmetrical breath sounds, no wheezes, no rhonchi   CV:   RRR, grade IV/VI crescendo/decrescendo murmur heard best at LLSB,  no diastolic murmur  Abdomen:  soft, non-tender, no masses   Extremities:  warm, well-perfused, pulses palpable, no LE edema  Rectal/GU  Deferred  Neuro:   Grossly non-focal and symmetrical throughout  Skin:   Clean and dry, no rashes, no breakdown   Diagnostic Tests:   ECHOCARDIOGRAM REPORT       Patient Name:  William Welch Date of Exam: 06/19/2020  Medical Rec #: [id number]   Height:    66.0 in  Accession #:  [id number]   Weight:    145.6 lb  Date of Birth: 02-08-1966    BSA:     1.747 m  Patient Age:  69 years    BP:      136/100 mmHg  Patient Gender: M       HR:      70 bpm.  Exam Location: [address]   Procedure: 2D Echo, Cardiac Doppler and Color Doppler   Indications:  R01.1 Murmur    History:    Patient has no prior history of Echocardiogram  examinations.         Signs/Symptoms:Murmur and Shortness of  Breath; Risk         Factors:Hypertension and Dyslipidemia.    Sonographer:  Coralyn Helling RDCS  Referring Phys: [id number] [place]    1. The aortic valve is calcified. Aortic valve regurgitation is mild.  Severe aortic valve stenosis. Aortic valve area, by VTI measures 0.60 cm.  Aortic valve mean gradient measures 44.7 mmHg. Aortic valve Vmax measures  4.44 m/s. Through morphologically  unclear, given demographic data, diagnosis of biscuspid aortic valve  should be considered.  2. Left ventricular ejection fraction, by estimation, is 55 to 60%. The  left ventricle has normal function. The left ventricle has no regional  wall motion abnormalities. There is mild concentric left ventricular  hypertrophy. Left ventricular diastolic  parameters are consistent with Grade I diastolic dysfunction (impaired  relaxation).  3. Right ventricular systolic function is normal. The right ventricular  size is normal.  4. Left atrial size was mildly dilated.  5. The mitral valve is normal in structure. No evidence of mitral valve  regurgitation. No evidence of mitral stenosis.  6. The inferior vena cava is normal in size with greater than 50%  respiratory variability, suggesting right atrial pressure of 3 mmHg.   Comparison(s): No prior Echocardiogram.   FINDINGS  Left Ventricle: Left ventricular ejection fraction, by estimation, is 55  to 60%. The left ventricle has normal function. The left ventricle has no  regional wall motion abnormalities. The left ventricular internal cavity  size was normal in size. There is  mild concentric left ventricular hypertrophy. Left ventricular diastolic  parameters are consistent with Grade I diastolic dysfunction (impaired  relaxation).   Right Ventricle: The right ventricular size is normal. No increase in  right ventricular wall thickness. Right ventricular systolic function is  normal.   Left Atrium: Left  atrial size was mildly dilated.   Right Atrium: Right atrial size was normal in size.   Pericardium: There is no evidence of pericardial effusion.   Mitral Valve: The mitral valve is normal in structure. No evidence of  mitral valve regurgitation. No evidence  of mitral valve stenosis.   Tricuspid Valve: The tricuspid valve is normal in structure. Tricuspid  valve regurgitation is trivial. No evidence of tricuspid stenosis.   Aortic Valve: The aortic valve is calcified. Aortic valve regurgitation is  mild. Aortic regurgitation PHT measures 507 msec. Severe aortic stenosis  is present. Aortic valve mean gradient measures 44.7 mmHg. Aortic valve  peak gradient measures 78.9 mmHg.  Aortic valve area, by VTI measures 0.60 cm.   Pulmonic Valve: The pulmonic valve was not well visualized. Pulmonic valve  regurgitation is not visualized. No evidence of pulmonic stenosis.   Aorta: The aortic root and ascending aorta are structurally normal, with  no evidence of dilitation.   Pulmonary Artery: The pulmonary artery is of normal size.   Venous: The inferior vena cava is normal in size with greater than 50%  respiratory variability, suggesting right atrial pressure of 3 mmHg.   IAS/Shunts: The atrial septum is grossly normal.     LEFT VENTRICLE  PLAX 2D  LVIDd:     4.20 cm Diastology  LVIDs:     2.60 cm LV e' medial:  4.90 cm/s  LV PW:     1.20 cm LV E/e' medial: 18.5  LV IVS:    1.20 cm LV e' lateral:  6.53 cm/s  LVOT diam:   2.00 cm LV E/e' lateral: 13.9  LV SV:     63  LV SV Index:  36  LVOT Area:   3.14 cm     RIGHT VENTRICLE       IVC  RV S prime:   10.80 cm/s IVC diam: 1.10 cm  TAPSE (M-mode): 1.8 cm   LEFT ATRIUM       Index    RIGHT ATRIUM      Index  LA diam:    2.80 cm 1.60 cm/m RA Pressure: 3.00 mmHg  LA Vol (A2C):  43.8 ml 25.07 ml/m RA Area:   12.30 cm  LA Vol (A4C):  59.5 ml 34.05 ml/m RA  Volume:  30.00 ml 17.17 ml/m  LA Biplane Vol: 54.7 ml 31.30 ml/m  AORTIC VALVE  AV Area (Vmax):  0.60 cm  AV Area (Vmean):  0.61 cm  AV Area (VTI):   0.60 cm  AV Vmax:      444.00 cm/s  AV Vmean:     312.333 cm/s  AV VTI:      1.043 m  AV Peak Grad:   78.9 mmHg  AV Mean Grad:   44.7 mmHg  LVOT Vmax:     85.50 cm/s  LVOT Vmean:    60.900 cm/s  LVOT VTI:     0.199 m  LVOT/AV VTI ratio: 0.19  AI PHT:      507 msec    AORTA  Ao Root diam: 2.80 cm  Ao Asc diam: 3.30 cm   MV E velocity: 90.70 cm/s  TRICUSPID VALVE  MV A velocity: 120.00 cm/s Estimated RAP: 3.00 mmHg  MV E/A ratio: 0.76               SHUNTS               Systemic VTI: 0.20 m               Systemic Diam: 2.00 cm   Rudean Haskell MD  Electronically signed by Rudean Haskell MD  Signature Date/Time: 06/19/2020/4:30:12 PM      RIGHT/LEFT HEART CATH AND CORONARY ANGIOGRAPHY    Conclusion    Ost RCA  to Prox RCA lesion is 20% stenosed.  Prox RCA to Mid RCA lesion is 20% stenosed.  Mid LAD lesion is 20% stenosed.   Mild non-obstructive CAD Severe aortic stenosis (mean gradient by cath 37.5 mmHg, peak to peak gradient 54 mmHg, AVA 0.77cm2).   Recommendations: Will continue planning for AVR. He would be a candidate for surgical AVR or TAVR but given his young age, he may be best treated with conventional surgical AVR. Will make a referral to our CT surgeons on the valve team.    Recommendations  Antiplatelet/Anticoag Will continue planning for AVR. He would be a candidate for surgical AVR or TAVR but given his young age, he may be best treated with conventional surgical AVR. Will make a referral to our CT surgeons on the valve team.   Indications  Severe aortic stenosis [I35.0 (ICD-10-CM)]   Procedural Details  Technical Details Indication: Severe aortic stenosis  Procedure: The  risks, benefits, complications, treatment options, and expected outcomes were discussed with the patient. The patient and/or family concurred with the proposed plan, giving informed consent. The patient was brought to the cath lab after IV hydration was given. The patient was sedated with Versed and Fentanyl. The IV catheter in the right antecubital vein was changed for a 5 [place] sheath. Right heart catheterization performed with a balloon tipped catheter. The right wrist was prepped and draped in a sterile fashion. 1% lidocaine was used for local anesthesia. Using the modified Seldinger access technique, a 5 French sheath was placed in the right radial artery. 3 mg Verapamil was given through the sheath. 3500 units IV heparin was given. Standard diagnostic catheters were used to perform selective coronary angiography. The aortic valve was crossed with an AL-1 catheter and a straight wire. The AL-1 was changed over a long wire for the pigtail catheter. LV pressures with the pigtail catheter. No LV gram. The sheath was removed from the right radial artery and a Terumo hemostasis band was applied at the arteriotomy site on the right wrist.    Estimated blood loss <50 mL.   During this procedure medications were administered to achieve and maintain moderate conscious sedation while the patient's heart rate, blood pressure, and oxygen saturation were continuously monitored and I was present face-to-face 100% of this time.   Medications (Filter: Administrations occurring from 0830 to 0930 on 07/01/20) (important) Continuous medications are totaled by the amount administered until 07/01/20 0930.    Heparin (Porcine) in NaCl 1000-0.9 UT/500ML-% SOLN (mL) Total volume:  1,000 mL  Date/Time Rate/Dose/Volume Action   07/01/20 0839 500 mL Given   0840 500 mL Given    fentaNYL (SUBLIMAZE) injection (mcg) Total dose:  50 mcg  Date/Time Rate/Dose/Volume Action   07/01/20 0844 50 mcg Given    midazolam  (VERSED) injection (mg) Total dose:  2 mg  Date/Time Rate/Dose/Volume Action   07/01/20 0844 2 mg Given    lidocaine (PF) (XYLOCAINE) 1 % injection (mL) Total volume:  5 mL  Date/Time Rate/Dose/Volume Action   07/01/20 0853 5 mL Given    Radial Cocktail/Verapamil only (mL) Total volume:  10 mL  Date/Time Rate/Dose/Volume Action   07/01/20 0857 10 mL Given    heparin sodium (porcine) injection (Units) Total dose:  3,500 Units  Date/Time Rate/Dose/Volume Action   07/01/20 0905 3,500 Units Given    iohexol (OMNIPAQUE) 350 MG/ML injection (mL) Total volume:  40 mL  Date/Time Rate/Dose/Volume Action   07/01/20 0917 40 mL Given    Sedation Time  Sedation Time Physician-1: 32 minutes 24 seconds   Contrast  Medication Name Total Dose  iohexol (OMNIPAQUE) 350 MG/ML injection 40 mL    Radiation/Fluoro  Fluoro time: 5.9 (min) DAP: 9.4 (Gycm2) Cumulative Air Kerma: 967 (mGy)   Complications   Complications documented before study signed (07/01/2020 9:52 AM)     RIGHT/LEFT HEART CATH AND CORONARY ANGIOGRAPHY  None Documented by Burnell Blanks, MD 07/01/2020 9:26 AM  Date Found: 07/01/2020  Time Range: Intraprocedure       Coronary Findings   Diagnostic Dominance: Right  Left Anterior Descending  Vessel is large.  Mid LAD lesion is 20% stenosed.  Left Circumflex  Vessel is large.  Second Obtuse Marginal Branch  Vessel is large in size.  Right Coronary Artery  Vessel is large.  Ost RCA to Prox RCA lesion is 20% stenosed.  Prox RCA to Mid RCA lesion is 20% stenosed.   Intervention   No interventions have been documented.  Coronary Diagrams   Diagnostic Dominance: Right    Intervention    Implants    No implant documentation for this case.    Syngo Images  Show images for CARDIAC CATHETERIZATION  Images on Long Term Storage  Show images for Ozie, Lupe to Procedure Log  Procedure Log     Hemo  Data  Flowsheet Row Most Recent Value  Fick Cardiac Output 5.28 L/min  Fick Cardiac Output Index 3.02 (L/min)/BSA  Aortic Mean Gradient 37.54 mmHg  Aortic Peak Gradient 54 mmHg  Aortic Valve Area 0.77  Aortic Value Area Index 0.44 cm2/BSA  RA A Wave 10 mmHg  RA V Wave 6 mmHg  RA Mean 4 mmHg  RV Systolic Pressure 29 mmHg  RV Diastolic Pressure 0 mmHg  RV EDP 6 mmHg  PA Systolic Pressure 25 mmHg  PA Diastolic Pressure 14 mmHg  PA Mean 19 mmHg  PW A Wave 14 mmHg  PW V Wave 9 mmHg  PW Mean 10 mmHg  AO Systolic Pressure 893 mmHg  AO Diastolic Pressure 90 mmHg  AO Mean 810 mmHg  LV Systolic Pressure 175 mmHg  LV Diastolic Pressure 12 mmHg  LV EDP 21 mmHg  AOp Systolic Pressure 102 mmHg  AOp Diastolic Pressure 92 mmHg  AOp Mean Pressure 585 mmHg  LVp Systolic Pressure 277 mmHg  LVp Diastolic Pressure 9 mmHg  LVp EDP Pressure 27 mmHg  QP/QS 1  TPVR Index 6.29 HRUI  TSVR Index 37.73 HRUI  PVR SVR Ratio 0.08  TPVR/TSVR Ratio 0.17      Impression:  Patient has stage D1 severe symptomatic aortic stenosis.  I have personally reviewed the patient's recent transthoracic echocardiogram and diagnostic cardiac catheterization.  Echocardiogram reveals moderate thickening and restricted leaflet mobility involving all 3 leaflets of the patient's aortic valve.  I suspect the patient may have a Sievers type I bicuspid aortic valve, although functional anatomy is not entirely clear.  Peak velocity across the aortic valve measured 4.4 m/s corresponding to mean transvalvular gradient estimated greater than 44 mmHg and aortic valve area calculated 0.60 cm by VTI.  Doppler velocity index was quite low at 0.19.  Left ventricular systolic function remain normal with ejection fraction estimated 55 to 60%.  There was mild left ventricular hypertrophy.  Diagnostic cardiac catheterization confirmed the presence of severe aortic stenosis with peak to peak and mean transvalvular gradients measured 54 and  37.5 mmHg, respectively, corresponding to aortic valve area calculated 0.77 cm.  Right heart pressures were normal  and the patient did not have significant atherosclerotic coronary artery disease.  Patient needs aortic valve replacement.  Risks associated with conventional surgery would be quite low the patient may be a good candidate for minimally invasive approach for surgery depending upon whether or not aortic root anatomy is large enough to facilitate aortic valve replacement without need for aortic root enlargement or root replacement.   Plan:  The patient and his wife were counseled at length regarding treatment alternatives for management of severe symptomatic aortic stenosis. Alternative approaches such as conventional aortic valve replacement, transcatheter aortic valve replacement, and continued medical therapy without intervention were compared and contrasted at length.  The risks associated with conventional surgical aortic valve replacement were discussed in detail, as were expectations for post-operative convalescence, alternative surgical approaches, prosthetic valve choices, and the presence or absence of other concomitant conditions which might require intervention such as root enlargement or root replacement.  Issues specific to transcatheter aortic valve replacement were discussed including questions about long term valve durability, the potential for paravalvular leak, possible increased risk of need for permanent pacemaker placement, the suitability of the patient's surgical anatomy, and other potential technical complications related to the procedure itself.  Long-term prognosis without surgical intervention was discussed.  All treatment options were discussed in the context of the patient's own specific clinical presentation, past medical history, long-term prognosis and life expectancy.  All of their questions have been addressed.  The patient desires to proceed with elective aortic  valve replacement via conventional surgical techniques as soon as practical.  As a next step the patient will undergo dental service consultation for routine dental exam.  The patient will also undergo CT angiography to evaluate the functional anatomy and size of the aortic root as well as to evaluate the feasibility of peripheral cannulation for surgery.  We tentatively plan to proceed with surgery on July 29, 2020.  The patient will return to our office for follow-up prior to surgery on July 27, 2020.  The patient has been advised to avoid any kind of strenuous physical exertion until after he has recovered from his surgery.  All questions have been answered.     I spent in excess of 90 minutes during the conduct of this office consultation and >50% of this time involved direct face-to-face encounter with the patient for counseling and/or coordination of their care.      Valentina Gu. Roxy Manns, MD 07/07/2020 10:38 AM

## 2020-07-07 NOTE — Patient Instructions (Signed)
Medication Instructions:  Your physician recommends that you continue on your current medications as directed. Please refer to the Current Medication list given to you today.  *If you need a refill on your cardiac medications before your next appointment, please call your pharmacy*  Follow-Up: At [facility], you and your health needs are our priority.  As part of our continuing mission to provide you with exceptional heart care, we have created designated Provider Care Teams.  These Care Teams include your primary Cardiologist (physician) and Advanced Practice Providers (APPs -  Physician Assistants and Nurse Practitioners) who all work together to provide you with the care you need, when you need it.  Your next appointment:   4 month(s)  The format for your next appointment:   In Person  Provider:   You may see Freada Bergeron, MD or one of the following Advanced Practice Providers on your designated Care Team:    Richardson Dopp, PA-C  Vin Rudd, [place]

## 2020-07-07 NOTE — Patient Instructions (Addendum)
Avoid strenuous exertion until after you have recovered from your surgery  Continue taking all current medications without change through the day before surgery.  Make sure to bring all of your medications with you when you come for your Pre-Admission Testing appointment at [facility] Short-Stay Department.  Have nothing to eat or drink after midnight the night before surgery.  On the morning of surgery you may take Prilosec and allopurinol but do not take any other medications.  At your appointment for Pre-Admission Testing at the [facility] Short-Stay Department you will be asked to sign permission forms for your upcoming surgery.  By definition your signature on these forms implies that you and/or your designee provide full informed consent for your planned surgical procedure(s), that alternative treatment options have been discussed, that you understand and accept any and all potential risks, and that you have some understanding of what to expect for your post-operative convalescence.  For elective aortic valve repair or replacement potential operative risks include but are not limited to at least some risk of death, stroke or other neurologic complication, myocardial infarction, congestive heart failure, respiratory failure, renal failure, bleeding requiring transfusion and/or reexploration, arrhythmia, heart block or bradycardia requiring permanent pacemaker insertion, infection or other wound complications, pneumonia, pleural and/or pericardial effusion, pulmonary embolus, aortic dissection or other major vascular complication, or other immediate or delayed complications related to valve repair or replacement including but not limited to aortic insufficiency, paravalvular leak, late structural valve deterioration and failure, thrombosis, embolization, or endocarditis.  Specific risks potentially related to the minimally-invasive approach include but are not  limited to risk of conversion to full or partial sternotomy, aortic dissection or other major vascular complication, unilateral acute lung injury or pulmonary edema, phrenic nerve dysfunction or paralysis, rib fracture, chronic pain, lung hernia, or lymphocele.  Please call to schedule a follow-up appointment in our office prior to surgery if you have any unresolved questions about your planned surgical procedure, the associated risks, alternative treatment options, and/or expectations for your post-operative recovery.  Endocarditis is a potentially serious infection of heart valves or inside lining of the heart.  It occurs more commonly in patients with diseased heart valves (such as patient's with aortic or mitral valve disease) and in patients who have undergone heart valve repair or replacement.  Certain surgical and dental procedures may put you at risk, such as dental cleaning, other dental procedures, or any surgery involving the respiratory, urinary, gastrointestinal tract, gallbladder or prostate gland.   To minimize your chances for develooping endocarditis, maintain good oral health and seek prompt medical attention for any infections involving the mouth, teeth, gums, skin or urinary tract.    Always notify your doctor or dentist about your underlying heart valve condition before having any invasive procedures. You will need to take antibiotics before certain procedures, including all routine dental cleanings or other dental procedures.  Your cardiologist or dentist should prescribe these antibiotics for you to be taken ahead of time.

## 2020-07-08 ENCOUNTER — Other Ambulatory Visit: Payer: Self-pay

## 2020-07-09 ENCOUNTER — Other Ambulatory Visit (HOSPITAL_COMMUNITY): Payer: Self-pay | Admitting: Emergency Medicine

## 2020-07-09 ENCOUNTER — Telehealth (HOSPITAL_COMMUNITY): Payer: Self-pay | Admitting: *Deleted

## 2020-07-09 DIAGNOSIS — I350 Nonrheumatic aortic (valve) stenosis: Secondary | ICD-10-CM

## 2020-07-09 MED ORDER — METOPROLOL TARTRATE 50 MG PO TABS: 50.0000 mg | ORAL_TABLET | Freq: Once | ORAL | 0 refills | Status: DC

## 2020-07-09 NOTE — Progress Notes (Signed)
One time dose 50mg  metoprolol tartrate ordered for HR control for upcoming cardiac CTA.   Pt to take 2 hr prior to scan  [facility] [phone] Office  [phone] Cell

## 2020-07-09 NOTE — Telephone Encounter (Signed)
Reaching out to patient to offer assistance regarding upcoming cardiac imaging study; pt verbalizes understanding of appt date/time, parking situation and where to check in, pre-test NPO status, and verified current allergies; name and call back number provided for further questions should they arise  Braxon Suder RN Navigator Cardiac Imaging [facility] [phone] office [phone] cell  

## 2020-07-10 ENCOUNTER — Ambulatory Visit (HOSPITAL_COMMUNITY)
Admission: RE | Admit: 2020-07-10 | Discharge: 2020-07-10 | Disposition: A | Discharge status: Home / Self Care | Payer: Commercial Managed Care - PPO | Source: Ambulatory Visit | Attending: Thoracic Surgery (Cardiothoracic Vascular Surgery) | Admitting: Thoracic Surgery (Cardiothoracic Vascular Surgery)

## 2020-07-10 ENCOUNTER — Other Ambulatory Visit: Payer: Self-pay

## 2020-07-10 DIAGNOSIS — I350 Nonrheumatic aortic (valve) stenosis: Secondary | ICD-10-CM | POA: Diagnosis present

## 2020-07-10 DIAGNOSIS — Z01818 Encounter for other preprocedural examination: Secondary | ICD-10-CM | POA: Diagnosis present

## 2020-07-10 MED ORDER — IOHEXOL 350 MG/ML SOLN
80.0000 mL | Freq: Once | INTRAVENOUS | Status: AC | PRN
  Administered 2020-07-10: 80 mL via INTRAVENOUS

## 2020-07-14 ENCOUNTER — Ambulatory Visit (INDEPENDENT_AMBULATORY_CARE_PROVIDER_SITE_OTHER): Payer: Self-pay | Admitting: Dentistry

## 2020-07-14 ENCOUNTER — Other Ambulatory Visit: Payer: Self-pay

## 2020-07-14 VITALS — BP 148/99 | HR 71 | Temp 98.3°F

## 2020-07-14 DIAGNOSIS — Z01818 Encounter for other preprocedural examination: Secondary | ICD-10-CM

## 2020-07-14 DIAGNOSIS — K06020 Generalized gingival recession, unspecified: Secondary | ICD-10-CM

## 2020-07-14 DIAGNOSIS — K0850 Unsatisfactory restoration of tooth, unspecified: Secondary | ICD-10-CM

## 2020-07-14 DIAGNOSIS — M270 Developmental disorders of jaws: Secondary | ICD-10-CM

## 2020-07-14 DIAGNOSIS — K08109 Complete loss of teeth, unspecified cause, unspecified class: Secondary | ICD-10-CM

## 2020-07-14 DIAGNOSIS — K029 Dental caries, unspecified: Secondary | ICD-10-CM

## 2020-07-14 DIAGNOSIS — K036 Deposits [accretions] on teeth: Secondary | ICD-10-CM

## 2020-07-14 DIAGNOSIS — K031 Abrasion of teeth: Secondary | ICD-10-CM

## 2020-07-14 DIAGNOSIS — K0530 Chronic periodontitis, unspecified: Secondary | ICD-10-CM

## 2020-07-14 DIAGNOSIS — K032 Erosion of teeth: Secondary | ICD-10-CM

## 2020-07-14 DIAGNOSIS — M264 Malocclusion, unspecified: Secondary | ICD-10-CM

## 2020-07-14 DIAGNOSIS — I350 Nonrheumatic aortic (valve) stenosis: Secondary | ICD-10-CM

## 2020-07-14 DIAGNOSIS — K030 Excessive attrition of teeth: Secondary | ICD-10-CM

## 2020-07-14 NOTE — Patient Instructions (Signed)
Perdido Benson Norway, D.M.D. Phone: [phone] Fax: [phone]   It was a pleasure seeing you today!  Please refer to the information below regarding your dental visit, and call us should you have any questions or concerns that may come up after you leave.   Thank you for giving [place] the opportunity to provide care for you.  If there is anything we can do for you, please let us know.    HEART VALVES AND MOUTH CARE   FACTS:  If you have any infection in your mouth, it can infect your heart valve.  If you heart valve is infected, you will be seriously ill.  Infections in the mouth can be SILENT and do not always cause pain.  Examples of infections in the mouth are gum disease, dental cavities, and abscesses.  Some possible signs of infection are: Bad breath, bleeding gums, or teeth that are sensitive to sweets, hot, and/or cold. There are many other signs as well.   WHAT YOU HAVE TO DO:  Brush your teeth after meals and at bedtime. Spend at least 2 minutes brushing well, especially behind your back teeth and all around your teeth that stand alone. Brush at the gumline also.  Do not go to bed without brushing your teeth and flossing.  If your gums bleed when you brush or floss, do NOT stop brushing or flossing.  Bleeding can be a sign of inflammation or irritation from bacteria.  It usually means that your gums need more attention and better cleaning.   If your Dentist or Dr. Benson Norway gave you a prescription mouthwash to use, make sure to use it as directed. If you run out of the medication, get a refill at the pharmacy.  If you were given any other medications or directions by your Dentist, please follow them. If you did not understand the directions or forget what you were told, please call. We will be happy to refresh your memory.  If you need antibiotics before dental procedures, make sure you take them one hour prior to every dental  visit as directed.   Get a dental check-up every 4-6 months in order to keep your mouth healthy, or to find and treat any new infection. You will most likely need your teeth cleaned or gums treated at the same time.  If you are not able to come in for your scheduled appointment, call your Dentist as soon as possible to reschedule.  If you have a problem in between dental visits, call your Dentist.    QUESTIONS? Marland Kitchen Call our office during office hours [phone].   WE ARE A TEAM.  OUR GOAL IS:  HEALTHY MOUTH, HEALTHY HEART

## 2020-07-14 NOTE — Progress Notes (Signed)
Department of Dental Medicine     OUTPATIENT CONSULTATION  Service Date:   07/14/2020  Patient Name:  William Welch Date of Birth:   1965-05-30 Medical Record Number: [id number]  Referring Provider:              Darylene Price, MD   PLAN & RECOMMENDATIONS  RECOMMENDATIONS > There are no current signs of acute dental infection including abscess, edema or erythema, or suspicious lesion requiring biopsy.  The patient does have a few cavities and generalized calculus build-up.  No recommendations for dental treatment prior to cardiac surgery at this time. >> Recommend that the patient establish care at a dental office of his choice for routine care including cleanings and periodic exams to decrease the risk of perioperative and postoperative systemic infection and/or other complications. >>> Plan to discuss with medical team and coordinate treatment as needed.   >>  Discussed in detail all treatment options with the patient and they are agreeable to the plan.   Thank you for consulting with Hospital Dent[facility] and for the opportunity to participate in this patient's treatment.  Should you have any questions or concerns, please contact the [facility] at [phone].   07/14/2020      CONSULT NOTE   COVID 19 SCREENING: The patient denies symptoms concerning for COVID-19 infection including fever, chills, cough, or newly developed shortness of breath.   HISTORY OF PRESENT ILLNESS: >> William Welch is a very pleasant 55 y.o. male with h/o GE reflux disease, gout, HTN, hyperlipidemia and heart murmur who was recently diagnosed with severe symptomatic aortic stenosis and is anticipating AVR.  The patient presents today for a medically necessary dental consultation as part of their pre-cardiac surgery work-up.  DENTAL HISTORY: > The patient reports that it has been several years since he has last seen a dentist.  He currently denies any dental/orofacial pain or sensitivity. >>  Patient is able to manage oral secretions.  Patient denies dysphagia, odynophagia, dysphonia and neck pain.  Patient denies fever, rigors and malaise.   CHIEF COMPLAINT:  Here for a preoperative dental evaluation.   Patient Active Problem List   Diagnosis Date Noted  . Severe aortic stenosis    Past Medical History:  Diagnosis Date  . Arthritis   . GERD (gastroesophageal reflux disease)   . Gout   . Heart murmur   . Hyperlipidemia   . Hypertension   . Severe aortic stenosis    Past Surgical History:  Procedure Laterality Date  . FACIAL RECONSTRUCTION SURGERY    . JOINT REPLACEMENT    . RIGHT/LEFT HEART CATH AND CORONARY ANGIOGRAPHY N/A 07/01/2020   Procedure: RIGHT/LEFT HEART CATH AND CORONARY ANGIOGRAPHY;  Surgeon: Burnell Blanks, MD;  Location: [facility];  Service: Cardiovascular;  Laterality: N/A;   Allergies  Allergen Reactions  . Percocet [Oxycodone-Acetaminophen] Hives   Current Outpatient Medications  Medication Sig Dispense Refill  . allopurinol (ZYLOPRIM) 300 MG tablet Take 300 mg by mouth daily.    . colchicine 0.6 MG tablet Take 0.6 mg by mouth daily as needed (Gout).    Marland Kitchen ezetimibe (ZETIA) 10 MG tablet Take 1 tablet (10 mg total) by mouth daily. 90 tablet 3  . omeprazole (PRILOSEC) 20 MG capsule Take 20 mg by mouth daily.    . rosuvastatin (CRESTOR) 20 MG tablet Take 20 mg by mouth daily.    . valsartan (DIOVAN) 40 MG tablet Take 1 tablet (40 mg total) by mouth daily.  90 tablet 3   No current facility-administered medications for this visit.    LABS: Lab Results  Component Value Date   WBC 6.1 06/24/2020   HGB 13.9 07/01/2020   HCT 41.0 07/01/2020   MCV 94 06/24/2020   PLT 158 06/24/2020      Component Value Date/Time   NA 142 07/01/2020 0907   NA 139 06/24/2020 0833   K 4.1 07/01/2020 0907   CL 101 06/24/2020 0833   CO2 23 06/24/2020 0833   GLUCOSE 127 (H) 06/24/2020 0833   BUN 10 06/24/2020 0833   CREATININE 1.05 06/24/2020  0833   CALCIUM 9.4 06/24/2020 0833   No results found for: INR, PROTIME No results found for: PTT  Social History   Socioeconomic History  . Marital status: Married    Spouse name: Not on file  . Number of children: Not on file  . Years of education: Not on file  . Highest education level: Not on file  Occupational History  . Not on file  Tobacco Use  . Smoking status: Never Smoker  . Smokeless tobacco: Never Used  Substance and Sexual Activity  . Alcohol use: Yes    Comment: drinks 5 or 6 beers daily.  . Drug use: Not Currently  . Sexual activity: Not on file  Other Topics Concern  . Not on file  Social History Narrative  . Not on file   Social Determinants of Health   Financial Resource Strain: Not on file  Food Insecurity: Not on file  Transportation Needs: Not on file  Physical Activity: Not on file  Stress: Not on file  Social Connections: Not on file  Intimate Partner Violence: Not on file   Family History  Problem Relation Age of Onset  . Colon cancer Neg Hx   . Esophageal cancer Neg Hx   . Rectal cancer Neg Hx   . Stomach cancer Neg Hx      REVIEW OF SYSTEMS: Reviewed with the patient as per HPI. PSYCH: Patient denies having dental phobia.  VITAL SIGNS: BP (!) 148/99 (BP Location: Right Arm)   Pulse 71   Temp 98.3 F (36.8 C) (Oral)    PHYSICAL EXAM: >> General:  Well-developed, comfortable and in no apparent distress. >> Neurological:  Alert and oriented to person, place and  time. >> Extraoral:  Facial symmetry present without any edema or erythema.  No swelling or lymphadenopathy.  TMJ asymptomatic without clicks or crepitations. >> Maximum Interincisal Opening: 45 mm >> Intraoral:  Soft tissues appear well-perfused and mucous membranes moist.  FOM and vestibules soft and not raised. Oral cavity without mass or lesion. No signs of infection, parulis, sinus tract, edema or erythema evident upon exam.   (+) Bilateral mandibular tori (+)  Narrow palatal vault   DENTAL EXAM: Hard tissue exam completed and charted.  >> Dentition:  Overall good  remaining dentition.  Missing teeth, caries, existing restorations.   >> The patient is maintaining fair oral hygiene.  >> Periodontal: Slighty inflamed and erythematous gingival tissue. Generalized calculus accumulation; heavy accumulation on mandibular anterior teeth. Generalized gingival recession.  >> Probing depths: Spot probing depths range between 1-4 mm >> Caries: #15 and #31 >> Defective Restorations: #3 fractured occlusal amalgam with leakage around margins, #15 recurrent decay under existing amalgam. >> Occlusion: Class 2 malocclusion; significant crowding of maxillary and mandibular anterior teeth. >> Other findings:  Attrition/wear on incisal edges of #23- #27. #4B(V) and #5B(V) abfraction.   RADIOGRAPHIC EXAM: PAN and  Full Mouth Series exposed and interpreted.  >> Condyles seated bilaterally in fossas.  No evidence of abnormal pathology.  All visualized osseous structures appear WNL. #15, #31 and #32 drifting mesially.  >> Generalized mild horizontal bone loss consistent with mild periodontitis.  Radiographic calculus accumulation present. >> Missing teeth #1, #14, #17 and #30. Existing restorations on teeth #3, #15, #18, #19, #20 and #32. Bilateral radiopacities evident superimposing mandibular anterior teeth consistent with tori.   ASSESSMENT:  1. Severe aortic stenosis 2. Preoperative dental consultation 3. Missing teeth 4. Caries 5. Accretions on teeth 6. Chronic periodontitis 7. Gingival recession, generalized 8. Defective dental restoration 9. Attrition/wear 10. 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  I explained that if any chronic or acute dental/oral infection(s) are addressed and subsequently not maintained following medical optimization and recovery, their risk of the previously mentioned complications are just as high and could potentially occur postoperatively.  I explained all significant findings of the dental consultation with the patient including significant calculus or tartar build-up on his teeth leading to chronic inflammation and periodontal disease and a few smaller cavities, and the recommended care including finding an outside dental office of his choice for routine care including cleanings, fillings and exams in order to optimize them from a dental standpoint.  The patient verbalized understanding of all findings, discussion, and recommendations. >>  We then discussed various treatment options to include no treatment, multiple extractions with alveoloplasty, pre-prosthetic surgery as indicated, periodontal therapy, dental restorations, root canal therapy, crown and bridge therapy, implant therapy, and replacement of missing teeth as indicated.  The patient verbalized understanding of all options, and currently wishes to proceed with finding a dentist to establish care with and see regularly once he recovers and is cleared for routine dental care by his medical team. >>>  Plan to discuss all findings and recommendations with medical team and coordinate future care as needed.  <>  The patient tolerated today's visit well.  All questions and concerns were addressed and answered, and the patient departed in stable condition.   I spent in excess of 120 minutes during the conduct of this consultation and >50% of this time involved direct face-to-face encounter for counseling and/or coordination of the patient's care. [place]  Benson [place], D.M.D.

## 2020-07-27 ENCOUNTER — Ambulatory Visit: Payer: Commercial Managed Care - PPO | Admitting: Thoracic Surgery (Cardiothoracic Vascular Surgery)

## 2020-07-27 ENCOUNTER — Ambulatory Visit (HOSPITAL_COMMUNITY)
Admission: RE | Admit: 2020-07-27 | Discharge: 2020-07-27 | Disposition: A | Discharge status: Home / Self Care | Payer: Commercial Managed Care - PPO | Source: Ambulatory Visit | Attending: Thoracic Surgery (Cardiothoracic Vascular Surgery) | Admitting: Thoracic Surgery (Cardiothoracic Vascular Surgery)

## 2020-07-27 ENCOUNTER — Other Ambulatory Visit: Payer: Self-pay

## 2020-07-27 ENCOUNTER — Encounter (HOSPITAL_COMMUNITY): Payer: Self-pay

## 2020-07-27 ENCOUNTER — Other Ambulatory Visit (HOSPITAL_COMMUNITY)
Admission: RE | Admit: 2020-07-27 | Discharge: 2020-07-27 | Disposition: A | Discharge status: Home / Self Care | Payer: Commercial Managed Care - PPO | Source: Ambulatory Visit

## 2020-07-27 ENCOUNTER — Ambulatory Visit (HOSPITAL_BASED_OUTPATIENT_CLINIC_OR_DEPARTMENT_OTHER)
Admission: RE | Admit: 2020-07-27 | Discharge: 2020-07-27 | Disposition: A | Discharge status: Home / Self Care | Payer: Commercial Managed Care - PPO | Source: Ambulatory Visit | Attending: Thoracic Surgery (Cardiothoracic Vascular Surgery) | Admitting: Thoracic Surgery (Cardiothoracic Vascular Surgery)

## 2020-07-27 ENCOUNTER — Encounter (HOSPITAL_COMMUNITY)
Admission: RE | Admit: 2020-07-27 | Discharge: 2020-07-27 | Disposition: A | Discharge status: Home / Self Care | Payer: Commercial Managed Care - PPO | Source: Ambulatory Visit | Attending: Thoracic Surgery (Cardiothoracic Vascular Surgery) | Admitting: Thoracic Surgery (Cardiothoracic Vascular Surgery)

## 2020-07-27 ENCOUNTER — Encounter: Payer: Self-pay | Admitting: Thoracic Surgery (Cardiothoracic Vascular Surgery)

## 2020-07-27 VITALS — BP 145/98 | HR 73 | Resp 20 | Ht 66.0 in

## 2020-07-27 DIAGNOSIS — I350 Nonrheumatic aortic (valve) stenosis: Secondary | ICD-10-CM

## 2020-07-27 DIAGNOSIS — Q230 Congenital stenosis of aortic valve: Secondary | ICD-10-CM | POA: Diagnosis not present

## 2020-07-27 DIAGNOSIS — Z01818 Encounter for other preprocedural examination: Secondary | ICD-10-CM | POA: Insufficient documentation

## 2020-07-27 DIAGNOSIS — D62 Acute posthemorrhagic anemia: Secondary | ICD-10-CM | POA: Diagnosis not present

## 2020-07-27 DIAGNOSIS — Z01812 Encounter for preprocedural laboratory examination: Secondary | ICD-10-CM | POA: Insufficient documentation

## 2020-07-27 DIAGNOSIS — Z20822 Contact with and (suspected) exposure to covid-19: Secondary | ICD-10-CM | POA: Insufficient documentation

## 2020-07-27 DIAGNOSIS — Z006 Encounter for examination for normal comparison and control in clinical research program: Secondary | ICD-10-CM | POA: Diagnosis not present

## 2020-07-27 LAB — PROTIME-INR
INR: 0.9 (ref 0.8–1.2)
Prothrombin Time: 12.5 seconds (ref 11.4–15.2)

## 2020-07-27 LAB — URINALYSIS, ROUTINE W REFLEX MICROSCOPIC
Bilirubin Urine: NEGATIVE
Glucose, UA: NEGATIVE mg/dL
Hgb urine dipstick: NEGATIVE
Ketones, ur: NEGATIVE mg/dL
Leukocytes,Ua: NEGATIVE
Nitrite: NEGATIVE
Protein, ur: NEGATIVE mg/dL
Specific Gravity, Urine: 1.006 (ref 1.005–1.030)
pH: 7 (ref 5.0–8.0)

## 2020-07-27 LAB — CBC
HCT: 41.6 % (ref 39.0–52.0)
Hemoglobin: 14.4 g/dL (ref 13.0–17.0)
MCH: 32.2 pg (ref 26.0–34.0)
MCHC: 34.6 g/dL (ref 30.0–36.0)
MCV: 93.1 fL (ref 80.0–100.0)
Platelets: 173 10*3/uL (ref 150–400)
RBC: 4.47 MIL/uL (ref 4.22–5.81)
RDW: 12.8 % (ref 11.5–15.5)
WBC: 6.9 10*3/uL (ref 4.0–10.5)
nRBC: 0 % (ref 0.0–0.2)

## 2020-07-27 LAB — COMPREHENSIVE METABOLIC PANEL
ALT: 38 U/L (ref 0–44)
AST: 36 U/L (ref 15–41)
Albumin: 4.1 g/dL (ref 3.5–5.0)
Alkaline Phosphatase: 61 U/L (ref 38–126)
Anion gap: 8 (ref 5–15)
BUN: 7 mg/dL (ref 6–20)
CO2: 23 mmol/L (ref 22–32)
Calcium: 9.1 mg/dL (ref 8.9–10.3)
Chloride: 104 mmol/L (ref 98–111)
Creatinine, Ser: 0.84 mg/dL (ref 0.61–1.24)
GFR, Estimated: >60 mL/min (ref >60)
Glucose, Bld: 107 mg/dL — ABNORMAL HIGH (ref 70–99)
Potassium: 4.1 mmol/L (ref 3.5–5.1)
Sodium: 135 mmol/L (ref 135–145)
Total Bilirubin: 0.6 mg/dL (ref 0.3–1.2)
Total Protein: 7 g/dL (ref 6.5–8.1)

## 2020-07-27 LAB — BLOOD GAS, ARTERIAL
Acid-base deficit: 0.3 mmol/L (ref 0.0–2.0)
Bicarbonate: 23.8 mmol/L (ref 20.0–28.0)
Drawn by: 58793
FIO2: 21
O2 Saturation: 97.2 %
Patient temperature: 37
pCO2 arterial: 38.4 mmHg (ref 32.0–48.0)
pH, Arterial: 7.409 (ref 7.350–7.450)
pO2, Arterial: 93.4 mmHg (ref 83.0–108.0)

## 2020-07-27 LAB — SURGICAL PCR SCREEN
MRSA, PCR: NEGATIVE
Staphylococcus aureus: NEGATIVE

## 2020-07-27 LAB — APTT: aPTT: 30 seconds (ref 24–36)

## 2020-07-27 LAB — HEMOGLOBIN A1C
Hgb A1c MFr Bld: 5.7 % — ABNORMAL HIGH (ref 4.8–5.6)
Mean Plasma Glucose: 116.89 mg/dL

## 2020-07-27 NOTE — Patient Instructions (Signed)
   Continue taking all current medications without change through the day before surgery.  Make sure to bring all of your medications with you when you come for your Pre-Admission Testing appointment at [facility] Short-Stay Department.  Have nothing to eat or drink after midnight the night before surgery.  On the morning of surgery take only Prilosec and allopurinol with a sip of water.

## 2020-07-27 NOTE — Progress Notes (Signed)
[address]       [place] [address]             [phone]     CARDIOTHORACIC SURGERY OFFICE NOTE  Referring Provider is Freada Bergeron, MD PCP is Leanna Battles, MD   HPI:  [name] with severe aortic stenosis, GE reflux disease, gout, hypertension, hyperlipidemia, and regular alcohol use who returns the office today with tentative plans to proceed with elective aortic valve replacement later this week.  He was originally seen in consultation on July 07, 2020.  Since then he underwent cardiac gated CT angiogram of the heart and CT angiogram of the aorta and iliac vessels.  He reports no new problems or complaints.  He describes stable symptoms of exertional shortness of breath and chest tightness.   Current Outpatient Medications  Medication Sig Dispense Refill  . allopurinol (ZYLOPRIM) 300 MG tablet Take 300 mg by mouth daily.    . colchicine 0.6 MG tablet Take 0.6 mg by mouth daily as needed (Gout).    Marland Kitchen ezetimibe (ZETIA) 10 MG tablet Take 1 tablet (10 mg total) by mouth daily. 90 tablet 3  . omeprazole (PRILOSEC) 20 MG capsule Take 20 mg by mouth daily.    . rosuvastatin (CRESTOR) 20 MG tablet Take 20 mg by mouth daily.    . valsartan (DIOVAN) 40 MG tablet Take 1 tablet (40 mg total) by mouth daily. 90 tablet 3   No current facility-administered medications for this visit.      Physical Exam:   BP (!) 145/98 (BP Location: Left Arm, Patient Position: Sitting)   Pulse 73   Resp 20   Ht 5\' 6"  (1.676 m)   SpO2 98% Comment: RA  BMI 23.98 kg/m   General:  Well-appearing  Chest:   Clear to auscultation  CV:   Grade 3/6 crescendo decrescendo systolic murmur  Incisions:  n/a  Abdomen:  Soft nontender  Extremities:  Warm and well-perfused  Diagnostic Tests:  Cardiac TAVR CT  TECHNIQUE: The patient was scanned on a Siemens Force 284 slice scanner. A 120 kV retrospective scan was triggered in the descending  thoracic aorta at 111 HU's. Gantry rotation speed was 270 msecs and collimation was .9 mm. No beta blockade or nitro were given. The 3D data set was reconstructed in 5% intervals of the R-R cycle. Systolic and diastolic phases were analyzed on a dedicated work station using MPR, MIP and VRT modes. The patient received 80 cc of contrast.  FINDINGS: Aortic Valve: Bicuspid calcified with restricted motion Sievers type one with raphe between right an non coronary cusps Thickened leaflet edges and raphe AV calcium score 2407 consistent with severe AS  Aorta: No aneurysm only mild atherosclerosis near innominate take off Normal branch vessels no coarctation  Sinotubular Junction: 23 mm  Ascending Thoracic Aorta: 32 mm  Aortic Arch: 25 mm  Descending Thoracic Aorta: 23 mm  Sinus of Valsalva Measurements:  Non-coronary: 27 mm  Right - coronary: 27 mm  Left - coronary: 28 mm  Coronary Artery Height above Annulus:  Left Main: 15.5 mm above annulus  Right Coronary: 13.3 mm above annulus  Virtual Basal Annulus Measurements:  Maximum/Minimum Diameter: 24.9 mm x 20.3 mm  Perimeter: 70 mm  Area: 387 mm 2  Coronary Arteries: Right dominant. Non obstructive <50% mixed plaque in proximal and mid LAD. 1-24% calcified plaque in proximal RCA and mid circumflex  Calcium score: 63 which is  84 th percentile for age and sex  IMPRESSION: 1. Bicuspid Aortic valve Sievers type one with severe AS and calcium score 2407 consistent with severe AS  2. Mild thoracic atherosclerosis with no aneurysm, coarctation and normal arch vessels  3. Coronary calcium score 63 which is 28 th percentile for age / sex. Non obstructive CAD Right dominant coronary arteries  4. Measurement for TAVR given above but patient appears to be having SAVR  Jenkins Rouge  Electronically Signed: By: Jenkins Rouge M.D. On: 07/10/2020 17:28    CTA CHEST FINDINGS  Cardiovascular:  Normal heart size. No significant pericardial effusion/thickening. Left anterior descending coronary atherosclerosis. Diffusely coarsely calcified and thick and aortic root. Great vessels are normal in course and caliber. No central pulmonary emboli.  Mediastinum/Nodes: No discrete thyroid nodules. Unremarkable esophagus. No pathologically enlarged axillary, mediastinal or hilar lymph nodes.  Lungs/Pleura: No pneumothorax. No pleural effusion. No acute consolidative airspace disease, lung masses or significant pulmonary nodules.  Musculoskeletal: No aggressive appearing focal osseous lesions. Mild thoracic spondylosis.  CTA ABDOMEN AND PELVIS FINDINGS  Hepatobiliary: Normal liver with no liver mass. Normal gallbladder with no radiopaque cholelithiasis. No biliary ductal dilatation.  Pancreas: Normal, with no mass or duct dilation.  Spleen: Normal size. No mass.  Adrenals/Urinary Tract: Normal adrenals. No contour deforming renal masses. No hydronephrosis. Normal bladder.  Stomach/Bowel: Small hiatal hernia. Otherwise normal nondistended stomach. Normal caliber small bowel with no small bowel wall thickening. Normal appendix. Mild sigmoid diverticulosis with no large bowel wall thickening or significant pericolonic fat stranding.  Vascular/Lymphatic: Mildly atherosclerotic nonaneurysmal abdominal aorta. Patent renal veins. No pathologically enlarged lymph nodes in the abdomen or pelvis.  Reproductive: Normal size prostate.  Other: No pneumoperitoneum, ascites or focal fluid collection.  Musculoskeletal: No aggressive appearing focal osseous lesions. Mild lumbar spondylosis.  VASCULAR MEASUREMENTS PERTINENT TO TAVR:  AORTA:  Minimal Aortic Diameter-12.7 x 12.0 mm  Severity of Aortic Calcification-mild  RIGHT PELVIS:  Right Common Iliac Artery -  Minimal Diameter-7.7 x 7.2 mm  Tortuosity-mild  Calcification-mild  Right External Iliac  Artery -  Minimal Diameter-6.6 x 6.5 mm  Tortuosity-mild  Calcification-none  Right Common Femoral Artery -  Minimal Diameter-6.9 x 6.6 mm  Tortuosity-mild  Calcification-mild  LEFT PELVIS:  Left Common Iliac Artery -  Minimal Diameter-7.5 x 7.3 mm  Tortuosity-mild  Calcification-mild  Left External Iliac Artery -  Minimal Diameter-6.8 x 6.6 mm  Tortuosity-mild  Calcification-none  Left Common Femoral Artery -  Minimal Diameter-7.5 x 5.8 mm  Tortuosity-mild  Calcification-moderate  Review of the MIP images confirms the above findings.  IMPRESSION: 1. Vascular findings and measurements pertinent to potential TAVR procedure, as detailed. 2. Diffusely coarsely calcified and thickened aortic valve, compatible with reported history of aortic stenosis. 3. One vessel coronary atherosclerosis. 4. Small hiatal hernia. 5. Mild sigmoid diverticulosis. 6. Aortic Atherosclerosis (ICD10-I70.0).  Electronically Signed: By: Ilona Sorrel M.D. On: 07/11/2020 05:19    Impression:  Patient has stage D1 severe symptomatic aortic stenosis.  I have personally reviewed the patient's recent transthoracic echocardiogram, diagnostic cardiac catheterization, and CT angiograms.  Echocardiogram reveals moderate thickening and restricted leaflet mobility involving all 3 leaflets of the patient's aortic valve.  I suspect the patient may have a Sievers type I bicuspid aortic valve, although functional anatomy is not entirely clear.  Peak velocity across the aortic valve measured 4.4 m/s corresponding to mean transvalvular gradient estimated greater than 44 mmHg and aortic valve area calculated 0.60 cm by VTI.  Doppler velocity  index was quite low at 0.19.  Left ventricular systolic function remain normal with ejection fraction estimated 55 to 60%.  There was mild left ventricular hypertrophy.  Diagnostic cardiac catheterization confirmed the presence of severe  aortic stenosis with peak to peak and mean transvalvular gradients measured 54 and 37.5 mmHg, respectively, corresponding to aortic valve area calculated 0.77 cm.  Right heart pressures were normal and the patient did not have significant atherosclerotic coronary artery disease.  Gated CT angiogram of the heart confirmed the presence of Sievers type I bicuspid aortic valve with findings consistent with severe aortic stenosis.  The patient also was noted to have relatively small size aortic root and aortic annulus which might require aortic root enlargement or root replacement to avoid the development of patient prosthesis mismatch.    Plan:  The patient and his wife were again counseled at length regarding treatment alternatives for management of severe aortic stenosis including continued medical therapy versus proceeding with aortic valve replacement in the near future.  The natural history of aortic stenosis was reviewed, as was long term prognosis with medical therapy alone.  Surgical options were discussed at length including conventional surgical aortic valve replacement through either a full median sternotomy or using minimally invasive techniques.  Because of the presence of relatively small aortic root and aortic annulus with possible need for root enlargement or root replacement I would not recommend performing valve replacement via minithoracotomy approach.  Discussion was held comparing the relative risks of mechanical valve replacement with need for lifelong anticoagulation versus use of a bioprosthetic tissue valve and the associated potential for late structural valve deterioration and failure.  This discussion was placed in the context of the patient's particular circumstances, and as a result the patient specifically requests that their valve be replaced using a bioprosthetic tissue valve.    The patient understands and accepts all potential associated risks of surgery including but not  limited to risk of death, stroke, myocardial infarction, congestive heart failure, respiratory failure, renal failure, pneumonia, bleeding requiring blood transfusion and or reexploration, arrhythmia, heart block or bradycardia requiring permanent pacemaker, aortic dissection or other major vascular complication, pleural effusions or other delayed complications related to continued congestive heart failure, and other late complications related to valve replacement including structural valve deterioration and failure, thrombosis, endocarditis, or paravalvular leak.  Patient is scheduled for surgery on July 29, 2020.  All questions answered.    I spent in excess of 15 minutes during the conduct of this office consultation and >50% of this time involved direct face-to-face encounter with the patient for counseling and/or coordination of their care.    Valentina Gu. Roxy Manns, MD 07/27/2020 4:17 PM

## 2020-07-27 NOTE — Pre-Procedure Instructions (Addendum)
William Welch  07/27/2020      Your procedure is scheduled on April 20.  Report to [facility], Main Entrance or Entrance "A" at 6:30 AM                Your surgery or procedure is scheduled to begin at 8:30 AM  Call this number if you have problems the morning of surgery: [phone]  This is the number for the Pre- Surgical Desk.                For any other questions, please call [phone], Monday - Friday 8 AM - 4 PM.   Remember:  Do not eat or drink after midnight.    Take these medicines the morning with a sip of water orning of surgery with A SIP OF WATER:  allopurinol (ZYLOPRIM) ezetimibe (ZETIA) omeprazole (PRILOSEC)   STOP taking Aspirin, Aspirin Products (Goody Powder, Excedrin Migraine), Ibuprofen (Advil), Naproxen (Aleve), Vitamins and Herbal Products (ie Fish Oil).    Special instructions:    [facility]- Preparing For Surgery  Before surgery, you can play an important role. Because skin is not sterile, your skin needs to be as free of germs as possible. You can reduce the number of germs on your skin by washing with CHG (chlorahexidine gluconate) Soap before surgery.  CHG is an antiseptic cleaner which kills germs and bonds with the skin to continue killing germs even after washing.    Oral Hygiene is also important to reduce your risk of infection.  Remember - BRUSH YOUR TEETH THE MORNING OF SURGERY WITH YOUR REGULAR TOOTHPASTE  Please do not use if you have an allergy to CHG or antibacterial soaps. If your skin becomes reddened/irritated stop using the CHG.  Do not shave (including legs and underarms) for at least 48 hours prior to first CHG shower. It is OK to shave your face.  Please follow these instructions carefully.   1. Shower the NIGHT BEFORE SURGERY and the MORNING OF SURGERY with CHG.   2. If you chose to wash your hair, wash your hair first as usual with your normal shampoo.  3. After you shampoo, wash your face and private area  with the soap you use at home, then rinse your hair and body thoroughly to remove the shampoo and soap.  4. Use CHG as you would any other liquid soap. You can apply CHG directly to the skin and wash gently with a scrungie or a clean washcloth.   5. Apply the CHG Soap to your body ONLY FROM THE NECK DOWN.  Do not use on open wounds or open sores. Avoid contact with your eyes, ears, mouth and genitals (private parts).   6. Wash thoroughly, paying special attention to the area where your surgery will be performed.  7. Thoroughly rinse your body with warm water from the neck down.  8. DO NOT shower/wash with your normal soap after using and rinsing off the CHG Soap.  9. Pat yourself dry with a CLEAN TOWEL.  10. Wear CLEAN PAJAMAS to bed the night before surgery, wear comfortable clothes the morning of surgery  11. Place CLEAN SHEETS on your bed the night of your first shower and DO NOT SLEEP WITH PETS.  Day of Surgery: Shower as instructed above. Do not apply any deodorants/lotions, powders or colognes.  Please wear clean clothes to the hospital/surgery center.   Remember to brush your teeth WITH YOUR REGULAR TOOTHPASTE.  Do not  wear jewelry, make-up or nail polish.  Do not shave 48 hours prior to surgery.  Men may shave face and neck.  Do not bring valuables to the hospital.  [facility] is not responsible for any belongings or valuables.  Contacts, dentures or bridgework may not be worn into surgery.  Leave your suitcase in the car.  After surgery it may be brought to your room.  For patients admitted to the hospital, discharge time will be determined by your treatment team.  Patients discharged the day of surgery will not be allowed to drive home.   Please read over the fact sheets that you were given.

## 2020-07-27 NOTE — Progress Notes (Signed)
PCP - Dr Bevelyn Buckles  Cardiologist - Dr. Gwyndolyn Kaufman  Chest x-ray - 07/27/20  EKG - 07/20/20  Stress Test - no  ECHO - 06/19/20  Cardiac Cath - 07/02/31  AICD-no PM-no LOOP-no  Sleep Study -  CPAP -   LABS: CBC, CMP, PT, PTT, ABG, A1C, PCR, COvid Test.  ASA-no  ERAS-no  HA1C-07/27/20 --5.7 Fasting Blood Sugar - na Checks Blood Sugar __0___ times a day  Anesthesia-  Pt denies having chest pain, sob, or fever at this time. All instructions explained to the pt, with a verbal understanding of the material. Pt agrees to go over the instructions while at home for a better understanding. Pt also instructed to self quarantine after being tested for COVID-19. The opportunity to ask questions was provided.

## 2020-07-27 NOTE — Progress Notes (Signed)
Pre cabg has been completed.   Preliminary results in CV Proc.   William Welch 07/27/2020 1:33 PM

## 2020-07-28 LAB — SARS CORONAVIRUS 2 (TAT 6-24 HRS): SARS Coronavirus 2: NEGATIVE

## 2020-07-28 MED ORDER — PHENYLEPHRINE HCL-NACL 20-0.9 MG/250ML-% IV SOLN
30.0000 ug/min | INTRAVENOUS | Status: AC
  Administered 2020-07-29: 50 ug/min via INTRAVENOUS
  Filled 2020-07-28: qty 250

## 2020-07-28 MED ORDER — SODIUM CHLORIDE 0.9 % IV SOLN
1.5000 g | INTRAVENOUS | Status: AC
  Administered 2020-07-29: 1.5 g via INTRAVENOUS
  Filled 2020-07-28: qty 1.5

## 2020-07-28 MED ORDER — SODIUM CHLORIDE 0.9 % IV SOLN
INTRAVENOUS | Status: DC
  Filled 2020-07-28: qty 30

## 2020-07-28 MED ORDER — EPINEPHRINE HCL 5 MG/250ML IV SOLN IN NS
0.0000 ug/min | INTRAVENOUS | Status: DC
  Filled 2020-07-28: qty 250

## 2020-07-28 MED ORDER — PLASMA-LYTE 148 IV SOLN
INTRAVENOUS | Status: DC
  Filled 2020-07-28: qty 2.5

## 2020-07-28 MED ORDER — POTASSIUM CHLORIDE 2 MEQ/ML IV SOLN
80.0000 meq | INTRAVENOUS | Status: DC
  Filled 2020-07-28: qty 40

## 2020-07-28 MED ORDER — INSULIN REGULAR(HUMAN) IN NACL 100-0.9 UT/100ML-% IV SOLN
INTRAVENOUS | Status: AC
  Administered 2020-07-29: .7 [IU]/h via INTRAVENOUS
  Filled 2020-07-28: qty 100

## 2020-07-28 MED ORDER — VANCOMYCIN HCL 1000 MG IV SOLR
INTRAVENOUS | Status: DC
  Filled 2020-07-28: qty 1000

## 2020-07-28 MED ORDER — NOREPINEPHRINE 4 MG/250ML-% IV SOLN
0.0000 ug/min | INTRAVENOUS | Status: DC
  Filled 2020-07-28: qty 250

## 2020-07-28 MED ORDER — MAGNESIUM SULFATE 50 % IJ SOLN
40.0000 meq | INTRAMUSCULAR | Status: DC
  Filled 2020-07-28: qty 9.85

## 2020-07-28 MED ORDER — TRANEXAMIC ACID (OHS) BOLUS VIA INFUSION
15.0000 mg/kg | INTRAVENOUS | Status: AC
  Administered 2020-07-29: 1011 mg via INTRAVENOUS
  Filled 2020-07-28: qty 1011

## 2020-07-28 MED ORDER — TRANEXAMIC ACID (OHS) PUMP PRIME SOLUTION
2.0000 mg/kg | INTRAVENOUS | Status: DC
  Filled 2020-07-28: qty 1.35

## 2020-07-28 MED ORDER — SODIUM CHLORIDE 0.9 % IV SOLN
750.0000 mg | INTRAVENOUS | Status: AC
  Administered 2020-07-29: 750 mg via INTRAVENOUS
  Filled 2020-07-28: qty 750

## 2020-07-28 MED ORDER — NITROGLYCERIN IN D5W 200-5 MCG/ML-% IV SOLN
2.0000 ug/min | INTRAVENOUS | Status: DC
  Filled 2020-07-28: qty 250

## 2020-07-28 MED ORDER — TRANEXAMIC ACID 1000 MG/10ML IV SOLN
1.5000 mg/kg/h | INTRAVENOUS | Status: AC
  Administered 2020-07-29: 1.5 mg/kg/h via INTRAVENOUS
  Filled 2020-07-28: qty 25

## 2020-07-28 MED ORDER — MILRINONE LACTATE IN DEXTROSE 20-5 MG/100ML-% IV SOLN
0.3000 ug/kg/min | INTRAVENOUS | Status: DC
  Filled 2020-07-28: qty 100

## 2020-07-28 MED ORDER — VANCOMYCIN HCL 1250 MG/250ML IV SOLN
1250.0000 mg | INTRAVENOUS | Status: AC
  Administered 2020-07-29: 1250 mg via INTRAVENOUS
  Filled 2020-07-28: qty 250

## 2020-07-28 MED ORDER — DEXMEDETOMIDINE HCL IN NACL 400 MCG/100ML IV SOLN
0.1000 ug/kg/h | INTRAVENOUS | Status: AC
  Administered 2020-07-29: .4 ug/kg/h via INTRAVENOUS
  Filled 2020-07-28: qty 100

## 2020-07-28 NOTE — H&P (Signed)
[address]       [place] [address]             [phone]          CARDIOTHORACIC SURGERY HISTORY AND PHYSICAL EXAM  Referring Provider is Freada Bergeron, MD PCP is Leanna Battles, MD  Chief Complaint  Patient presents with  . Aortic Stenosis    Initial surgical consult, ECHO 3/11, TEE 3/16, Cath 3/23    HPI:  [name] with history of GE reflux disease, gout, hypertension, hyperlipidemia, and known history of heart murmur who has been referred for surgical consultation for management of recently discovered severe symptomatic aortic stenosis.  Patient states that he was told he had a heart murmur many years ago.  He never had it formally evaluated until recently.  Over the past 2 to 3 months he has developed symptoms of exertional shortness of breath and chest tightness.  He was seen by his primary care physician who noted a prominent systolic murmur on physical exam.  He was referred for cardiology evaluation and was seen by Dr. Johney Frame on June 16, 2020.  Echocardiogram performed June 19, 2020 revealed severe aortic stenosis with normal left ventricular systolic function.  The patient was referred to the multidisciplinary heart valve clinic and underwent diagnostic cardiac catheterization by Dr. Angelena Form on July 01, 2020.  Catheterization confirmed the presence of severe aortic stenosis with peak to peak and mean transvalvular gradients measured 54 and 37.5 mmHg, respectively, corresponding to aortic valve area calculated 0.77 cm.  Right heart pressures were normal and the patient did not have significant coronary artery disease.  Patient was referred for surgical consultation.  Patient is married and lives locally in [place] with his wife.  They have 2 young adult sons.  The patient works full-time as a Special educational needs teacher firm.  He has been otherwise healthy throughout his life and reports no physical limitations  until the recent development of symptoms of exertional shortness of breath and chest tightness.  Symptoms of shortness of breath and chest discomfort only occur with more strenuous physical exertion and do not occur with ordinary low-level activity.  The patient has never had any resting shortness of breath or chest discomfort.  He denies any history of PND, orthopnea, or lower extremity edema.  He has not had any palpitations, dizzy spells, nor syncope.  Patient returns to the office today with tentative plans to proceed with elective aortic valve replacement later this week.  He was originally seen in consultation on July 07, 2020.  Since then he underwent cardiac gated CT angiogram of the heart and CT angiogram of the aorta and iliac vessels.  He reports no new problems or complaints.  He describes stable symptoms of exertional shortness of breath and chest tightness.    Past Medical History:  Diagnosis Date  . Arthritis   . GERD (gastroesophageal reflux disease)   . Gout   . Heart murmur   . Hyperlipidemia   . Hypertension   . Severe aortic stenosis     Past Surgical History:  Procedure Laterality Date  . FACIAL RECONSTRUCTION SURGERY    . JOINT REPLACEMENT Right    knee  . RIGHT/LEFT HEART CATH AND CORONARY ANGIOGRAPHY N/A 07/01/2020   Procedure: RIGHT/LEFT HEART CATH AND CORONARY ANGIOGRAPHY;  Surgeon: Burnell Blanks, MD;  Location: [facility];  Service: Cardiovascular;  Laterality: N/A;    Family History  Problem  Relation Age of Onset  . Colon cancer Neg Hx   . Esophageal cancer Neg Hx   . Rectal cancer Neg Hx   . Stomach cancer Neg Hx     Social History Social History   Tobacco Use  . Smoking status: Never Smoker  . Smokeless tobacco: Never Used  Vaping Use  . Vaping Use: Never used  Substance Use Topics  . Alcohol use: Yes    Alcohol/week: 21.0 standard drinks    Types: 21 Cans of beer per week    Comment: 06/26/20  . Drug use: Not Currently     Prior to Admission medications   Medication Sig Start Date End Date Taking? Authorizing Provider  allopurinol (ZYLOPRIM) 300 MG tablet Take 300 mg by mouth daily.   Yes [provider]  colchicine 0.6 MG tablet Take 0.6 mg by mouth daily as needed (Gout).   Yes [provider]  ezetimibe (ZETIA) 10 MG tablet Take 1 tablet (10 mg total) by mouth daily. 06/16/20 09/14/20 Yes Freada Bergeron, MD  omeprazole (PRILOSEC) 20 MG capsule Take 20 mg by mouth daily.   Yes [provider]  rosuvastatin (CRESTOR) 20 MG tablet Take 20 mg by mouth daily. 05/10/19  Yes [provider]  valsartan (DIOVAN) 40 MG tablet Take 1 tablet (40 mg total) by mouth daily. 06/16/20  Yes Freada Bergeron, MD    Allergies  Allergen Reactions  . Percocet [Oxycodone-Acetaminophen] Hives     Review of Systems:              General:                      normal appetite, decreased energy, no weight gain, no weight loss, no fever             Cardiac:                       + chest pain with exertion, no chest pain at rest, +SOB with exertion, no resting SOB, no PND, no orthopnea, no palpitations, no arrhythmia, no atrial fibrillation, no LE edema, no dizzy spells, no syncope             Respiratory:                 no shortness of breath, no home oxygen, no productive cough, no dry cough, no bronchitis, no wheezing, no hemoptysis, no asthma, no pain with inspiration or cough, no sleep apnea, no CPAP at night             GI:                               no difficulty swallowing, + reflux, + frequent heartburn, no hiatal hernia, no abdominal pain, no constipation, no diarrhea, no hematochezia, no hematemesis, no melena             GU:                              no dysuria,  no frequency, no urinary tract infection, no hematuria, no enlarged prostate, no kidney stones, no kidney disease             Vascular:                     no  pain suggestive of claudication, no pain in feet, no leg  cramps, no varicose veins, no DVT, no non-healing foot ulcer             Neuro:                         no stroke, no TIA's, no seizures, no headaches, no temporary blindness one eye,  no slurred speech, no peripheral neuropathy, no chronic pain, no instability of gait, no memory/cognitive dysfunction             Musculoskeletal:         + arthritis, no joint swelling, no myalgias, no difficulty walking, normal mobility              Skin:                            no rash, no itching, no skin infections, no pressure sores or ulcerations             Psych:                         no anxiety, no depression, no nervousness, no unusual recent stress             Eyes:                           no blurry vision, no floaters, no recent vision changes,  wears glasses only for reading             ENT:                            no hearing loss, no loose or painful teeth, no dentures, last saw dentist > 2 years ago             Hematologic:               no easy bruising, no abnormal bleeding, no clotting disorder, no frequent epistaxis             Endocrine:                   no diabetes, does not check CBG's at home                                                       Physical Exam:              BP 136/90 (BP Location: Right Arm, Patient Position: Sitting)   Pulse 77   Resp 20   Ht 5\' 6"  (1.676 m)   Wt 147 lb (66.7 kg)   SpO2 99% Comment: RA  BMI 23.73 kg/m              General:                        well-appearing             HEENT:                       Unremarkable  Neck:                           no JVD, no bruits, no adenopathy              Chest:                          clear to auscultation, symmetrical breath sounds, no wheezes, no rhonchi              CV:                              RRR, grade IV/VI crescendo/decrescendo murmur heard best at LLSB,  no diastolic murmur             Abdomen:                    soft, non-tender, no masses              Extremities:                  warm, well-perfused, pulses palpable, no LE edema             Rectal/GU                   Deferred             Neuro:                         Grossly non-focal and symmetrical throughout             Skin:                            Clean and dry, no rashes, no breakdown   Diagnostic Tests:   ECHOCARDIOGRAM REPORT       Patient Name:  William Welch Date of Exam: 06/19/2020  Medical Rec #: [id number]   Height:    66.0 in  Accession #:  [id number]   Weight:    145.6 lb  Date of Birth: 02/13/1966    BSA:     1.747 m  Patient Age:  55 years    BP:      136/100 mmHg  Patient Gender: M       HR:      70 bpm.  Exam Location: [address]   Procedure: 2D Echo, Cardiac Doppler and Color Doppler   Indications:  R01.1 Murmur    History:    Patient has no prior history of Echocardiogram  examinations.         Signs/Symptoms:Murmur and Shortness of Breath; Risk         Factors:Hypertension and Dyslipidemia.    Sonographer:  Coralyn Helling RDCS  Referring Phys: [id number] [place]    1. The aortic valve is calcified. Aortic valve regurgitation is mild.  Severe aortic valve stenosis. Aortic valve area, by VTI measures 0.60 cm.  Aortic valve mean gradient measures 44.7 mmHg. Aortic valve Vmax measures  4.44 m/s. Through morphologically  unclear, given demographic data, diagnosis of biscuspid aortic valve  should be considered.  2. Left ventricular ejection fraction, by estimation, is 55 to 60%. The  left ventricle has normal function. The left ventricle has no regional  wall motion abnormalities.  There is mild concentric left ventricular  hypertrophy. Left ventricular diastolic  parameters are consistent with Grade I diastolic dysfunction (impaired  relaxation).  3. Right ventricular systolic function is normal. The right ventricular  size is normal.  4. Left atrial  size was mildly dilated.  5. The mitral valve is normal in structure. No evidence of mitral valve  regurgitation. No evidence of mitral stenosis.  6. The inferior vena cava is normal in size with greater than 50%  respiratory variability, suggesting right atrial pressure of 3 mmHg.   Comparison(s): No prior Echocardiogram.   FINDINGS  Left Ventricle: Left ventricular ejection fraction, by estimation, is 55  to 60%. The left ventricle has normal function. The left ventricle has no  regional wall motion abnormalities. The left ventricular internal cavity  size was normal in size. There is  mild concentric left ventricular hypertrophy. Left ventricular diastolic  parameters are consistent with Grade I diastolic dysfunction (impaired  relaxation).   Right Ventricle: The right ventricular size is normal. No increase in  right ventricular wall thickness. Right ventricular systolic function is  normal.   Left Atrium: Left atrial size was mildly dilated.   Right Atrium: Right atrial size was normal in size.   Pericardium: There is no evidence of pericardial effusion.   Mitral Valve: The mitral valve is normal in structure. No evidence of  mitral valve regurgitation. No evidence of mitral valve stenosis.   Tricuspid Valve: The tricuspid valve is normal in structure. Tricuspid  valve regurgitation is trivial. No evidence of tricuspid stenosis.   Aortic Valve: The aortic valve is calcified. Aortic valve regurgitation is  mild. Aortic regurgitation PHT measures 507 msec. Severe aortic stenosis  is present. Aortic valve mean gradient measures 44.7 mmHg. Aortic valve  peak gradient measures 78.9 mmHg.  Aortic valve area, by VTI measures 0.60 cm.   Pulmonic Valve: The pulmonic valve was not well visualized. Pulmonic valve  regurgitation is not visualized. No evidence of pulmonic stenosis.   Aorta: The aortic root and ascending aorta are structurally normal, with  no evidence of  dilitation.   Pulmonary Artery: The pulmonary artery is of normal size.   Venous: The inferior vena cava is normal in size with greater than 50%  respiratory variability, suggesting right atrial pressure of 3 mmHg.   IAS/Shunts: The atrial septum is grossly normal.     LEFT VENTRICLE  PLAX 2D  LVIDd:     4.20 cm Diastology  LVIDs:     2.60 cm LV e' medial:  4.90 cm/s  LV PW:     1.20 cm LV E/e' medial: 18.5  LV IVS:    1.20 cm LV e' lateral:  6.53 cm/s  LVOT diam:   2.00 cm LV E/e' lateral: 13.9  LV SV:     63  LV SV Index:  36  LVOT Area:   3.14 cm     RIGHT VENTRICLE       IVC  RV S prime:   10.80 cm/s IVC diam: 1.10 cm  TAPSE (M-mode): 1.8 cm   LEFT ATRIUM       Index    RIGHT ATRIUM      Index  LA diam:    2.80 cm 1.60 cm/m RA Pressure: 3.00 mmHg  LA Vol (A2C):  43.8 ml 25.07 ml/m RA Area:   12.30 cm  LA Vol (A4C):  59.5 ml 34.05 ml/m RA Volume:  30.00 ml 17.17 ml/m  LA Biplane Vol: 54.7 ml 31.30  ml/m  AORTIC VALVE  AV Area (Vmax):  0.60 cm  AV Area (Vmean):  0.61 cm  AV Area (VTI):   0.60 cm  AV Vmax:      444.00 cm/s  AV Vmean:     312.333 cm/s  AV VTI:      1.043 m  AV Peak Grad:   78.9 mmHg  AV Mean Grad:   44.7 mmHg  LVOT Vmax:     85.50 cm/s  LVOT Vmean:    60.900 cm/s  LVOT VTI:     0.199 m  LVOT/AV VTI ratio: 0.19  AI PHT:      507 msec    AORTA  Ao Root diam: 2.80 cm  Ao Asc diam: 3.30 cm   MV E velocity: 90.70 cm/s  TRICUSPID VALVE  MV A velocity: 120.00 cm/s Estimated RAP: 3.00 mmHg  MV E/A ratio: 0.76               SHUNTS               Systemic VTI: 0.20 m               Systemic Diam: 2.00 cm   Rudean Haskell MD  Electronically signed by Rudean Haskell MD  Signature Date/Time: 06/19/2020/4:30:12 PM    RIGHT/LEFT HEART CATH AND CORONARY ANGIOGRAPHY     Conclusion    Ost RCA to Prox RCA lesion is 20% stenosed.  Prox RCA to Mid RCA lesion is 20% stenosed.  Mid LAD lesion is 20% stenosed.  Mild non-obstructive CAD Severe aortic stenosis (mean gradient by cath 37.5 mmHg, peak to peak gradient 54 mmHg, AVA 0.77cm2).   Recommendations: Will continue planning for AVR. He would be a candidate for surgical AVR or TAVR but given his young age, he may be best treated with conventional surgical AVR. Will make a referral to our CT surgeons on the valve team.    Recommendations  Antiplatelet/Anticoag Will continue planning for AVR. He would be a candidate for surgical AVR or TAVR but given his young age, he may be best treated with conventional surgical AVR. Will make a referral to our CT surgeons on the valve team.   Indications  Severe aortic stenosis [I35.0 (ICD-10-CM)]   Procedural Details  Technical Details Indication: Severe aortic stenosis  Procedure: The risks, benefits, complications, treatment options, and expected outcomes were discussed with the patient. The patient and/or family concurred with the proposed plan, giving informed consent. The patient was brought to the cath lab after IV hydration was given. The patient was sedated with Versed and Fentanyl. The IV catheter in the right antecubital vein was changed for a 5 [place] sheath. Right heart catheterization performed with a balloon tipped catheter. The right wrist was prepped and draped in a sterile fashion. 1% lidocaine was used for local anesthesia. Using the modified Seldinger access technique, a 5 French sheath was placed in the right radial artery. 3 mg Verapamil was given through the sheath. 3500 units IV heparin was given. Standard diagnostic catheters were used to perform selective coronary angiography. The aortic valve was crossed with an AL-1 catheter and a straight wire. The AL-1 was changed over a long wire for the pigtail catheter. LV pressures with  the pigtail catheter. No LV gram. The sheath was removed from the right radial artery and a Terumo hemostasis band was applied at the arteriotomy site on the right wrist.    Estimated blood loss <50 mL.   During this procedure  medications were administered to achieve and maintain moderate conscious sedation while the patient's heart rate, blood pressure, and oxygen saturation were continuously monitored and I was present face-to-face 100% of this time.   Medications (Filter: Administrations occurring from 0830 to 0930 on 07/01/20) (important) Continuous medications are totaled by the amount administered until 07/01/20 0930.    Heparin (Porcine) in NaCl 1000-0.9 UT/500ML-% SOLN (mL) Total volume:  1,000 mL  Date/Time Rate/Dose/Volume Action   07/01/20 0839 500 mL Given   0840 500 mL Given    fentaNYL (SUBLIMAZE) injection (mcg) Total dose:  50 mcg  Date/Time Rate/Dose/Volume Action   07/01/20 0844 50 mcg Given    midazolam (VERSED) injection (mg) Total dose:  2 mg  Date/Time Rate/Dose/Volume Action   07/01/20 0844 2 mg Given    lidocaine (PF) (XYLOCAINE) 1 % injection (mL) Total volume:  5 mL  Date/Time Rate/Dose/Volume Action   07/01/20 0853 5 mL Given    Radial Cocktail/Verapamil only (mL) Total volume:  10 mL  Date/Time Rate/Dose/Volume Action   07/01/20 0857 10 mL Given    heparin sodium (porcine) injection (Units) Total dose:  3,500 Units  Date/Time Rate/Dose/Volume Action   07/01/20 0905 3,500 Units Given    iohexol (OMNIPAQUE) 350 MG/ML injection (mL) Total volume:  40 mL  Date/Time Rate/Dose/Volume Action   07/01/20 0917 40 mL Given    Sedation Time  Sedation Time Physician-1: 32 minutes 24 seconds   Contrast  Medication Name Total Dose  iohexol (OMNIPAQUE) 350 MG/ML injection 40 mL    Radiation/Fluoro  Fluoro time: 5.9 (min) DAP: 9.4 (Gycm2) Cumulative Air Kerma: 779  (mGy)   Complications   Complications documented before study signed (07/01/2020 9:52 AM)     RIGHT/LEFT HEART CATH AND CORONARY ANGIOGRAPHY  None Documented by Burnell Blanks, MD 07/01/2020 9:26 AM  Date Found: 07/01/2020  Time Range: Intraprocedure       Coronary Findings   Diagnostic Dominance: Right  Left Anterior Descending  Vessel is large.  Mid LAD lesion is 20% stenosed.  Left Circumflex  Vessel is large.  Second Obtuse Marginal Branch  Vessel is large in size.  Right Coronary Artery  Vessel is large.  Ost RCA to Prox RCA lesion is 20% stenosed.  Prox RCA to Mid RCA lesion is 20% stenosed.   Intervention   No interventions have been documented.  Coronary Diagrams   Diagnostic Dominance: Right    Intervention    Implants    No implant documentation for this case.    Syngo Images  Show images for CARDIAC CATHETERIZATION  Images on Long Term Storage  Show images for Ryver, [place] to Procedure Log  Procedure Log     Hemo Data  Flowsheet Row Most Recent Value  Fick Cardiac Output 5.28 L/min  Fick Cardiac Output Index 3.02 (L/min)/BSA  Aortic Mean Gradient 37.54 mmHg  Aortic Peak Gradient 54 mmHg  Aortic Valve Area 0.77  Aortic Value Area Index 0.44 cm2/BSA  RA A Wave 10 mmHg  RA V Wave 6 mmHg  RA Mean 4 mmHg  RV Systolic Pressure 29 mmHg  RV Diastolic Pressure 0 mmHg  RV EDP 6 mmHg  PA Systolic Pressure 25 mmHg  PA Diastolic Pressure 14 mmHg  PA Mean 19 mmHg  PW A Wave 14 mmHg  PW V Wave 9 mmHg  PW Mean 10 mmHg  AO Systolic Pressure 390 mmHg  AO Diastolic Pressure 90 mmHg  AO Mean 300 mmHg  LV Systolic  Pressure 694 mmHg  LV Diastolic Pressure 12 mmHg  LV EDP 21 mmHg  AOp Systolic Pressure 503 mmHg  AOp Diastolic Pressure 92 mmHg  AOp Mean Pressure 888 mmHg  LVp Systolic Pressure 280 mmHg  LVp Diastolic Pressure 9 mmHg  LVp EDP Pressure 27 mmHg  QP/QS 1  TPVR  Index 6.29 HRUI  TSVR Index 37.73 HRUI  PVR SVR Ratio 0.08  TPVR/TSVR Ratio 0.17         Cardiac TAVR CT  TECHNIQUE: The patient was scanned on a Siemens Force 034 slice scanner. A 120 kV retrospective scan was triggered in the descending thoracic aorta at 111 HU's. Gantry rotation speed was 270 msecs and collimation was .9 mm. No beta blockade or nitro were given. The 3D data set was reconstructed in 5% intervals of the R-R cycle. Systolic and diastolic phases were analyzed on a dedicated work station using MPR, MIP and VRT modes. The patient received 80 cc of contrast.  FINDINGS: Aortic Valve: Bicuspid calcified with restricted motion Sievers type one with raphe between right an non coronary cusps Thickened leaflet edges and raphe AV calcium score 2407 consistent with severe AS  Aorta: No aneurysm only mild atherosclerosis near innominate take off Normal branch vessels no coarctation  Sinotubular Junction: 23 mm  Ascending Thoracic Aorta: 32 mm  Aortic Arch: 25 mm  Descending Thoracic Aorta: 23 mm  Sinus of Valsalva Measurements:  Non-coronary: 27 mm  Right - coronary: 27 mm  Left - coronary: 28 mm  Coronary Artery Height above Annulus:  Left Main: 15.5 mm above annulus  Right Coronary: 13.3 mm above annulus  Virtual Basal Annulus Measurements:  Maximum/Minimum Diameter: 24.9 mm x 20.3 mm  Perimeter: 70 mm  Area: 387 mm 2  Coronary Arteries: Right dominant. Non obstructive <50% mixed plaque in proximal and mid LAD. 1-24% calcified plaque in proximal RCA and mid circumflex  Calcium score: 63 which is 55 th percentile for age and sex  IMPRESSION: 1. Bicuspid Aortic valve Sievers type one with severe AS and calcium score 2407 consistent with severe AS  2. Mild thoracic atherosclerosis with no aneurysm, coarctation and normal arch vessels  3. Coronary calcium score 63 which is 72 th percentile for age / sex. Non  obstructive CAD Right dominant coronary arteries  4. Measurement for TAVR given above but patient appears to be having SAVR  Jenkins Rouge  Electronically Signed: By: Jenkins Rouge M.D. On: 07/10/2020 17:28    CTA CHEST FINDINGS  Cardiovascular: Normal heart size. No significant pericardial effusion/thickening. Left anterior descending coronary atherosclerosis. Diffusely coarsely calcified and thick and aortic root. Great vessels are normal in course and caliber. No central pulmonary emboli.  Mediastinum/Nodes: No discrete thyroid nodules. Unremarkable esophagus. No pathologically enlarged axillary, mediastinal or hilar lymph nodes.  Lungs/Pleura: No pneumothorax. No pleural effusion. No acute consolidative airspace disease, lung masses or significant pulmonary nodules.  Musculoskeletal: No aggressive appearing focal osseous lesions. Mild thoracic spondylosis.  CTA ABDOMEN AND PELVIS FINDINGS  Hepatobiliary: Normal liver with no liver mass. Normal gallbladder with no radiopaque cholelithiasis. No biliary ductal dilatation.  Pancreas: Normal, with no mass or duct dilation.  Spleen: Normal size. No mass.  Adrenals/Urinary Tract: Normal adrenals. No contour deforming renal masses. No hydronephrosis. Normal bladder.  Stomach/Bowel: Small hiatal hernia. Otherwise normal nondistended stomach. Normal caliber small bowel with no small bowel wall thickening. Normal appendix. Mild sigmoid diverticulosis with no large bowel wall thickening or significant pericolonic fat stranding.  Vascular/Lymphatic: Mildly atherosclerotic nonaneurysmal  abdominal aorta. Patent renal veins. No pathologically enlarged lymph nodes in the abdomen or pelvis.  Reproductive: Normal size prostate.  Other: No pneumoperitoneum, ascites or focal fluid collection.  Musculoskeletal: No aggressive appearing focal osseous lesions. Mild lumbar spondylosis.  VASCULAR MEASUREMENTS  PERTINENT TO TAVR:  AORTA:  Minimal Aortic Diameter-12.7 x 12.0 mm  Severity of Aortic Calcification-mild  RIGHT PELVIS:  Right Common Iliac Artery -  Minimal Diameter-7.7 x 7.2 mm  Tortuosity-mild  Calcification-mild  Right External Iliac Artery -  Minimal Diameter-6.6 x 6.5 mm  Tortuosity-mild  Calcification-none  Right Common Femoral Artery -  Minimal Diameter-6.9 x 6.6 mm  Tortuosity-mild  Calcification-mild  LEFT PELVIS:  Left Common Iliac Artery -  Minimal Diameter-7.5 x 7.3 mm  Tortuosity-mild  Calcification-mild  Left External Iliac Artery -  Minimal Diameter-6.8 x 6.6 mm  Tortuosity-mild  Calcification-none  Left Common Femoral Artery -  Minimal Diameter-7.5 x 5.8 mm  Tortuosity-mild  Calcification-moderate  Review of the MIP images confirms the above findings.  IMPRESSION: 1. Vascular findings and measurements pertinent to potential TAVR procedure, as detailed. 2. Diffusely coarsely calcified and thickened aortic valve, compatible with reported history of aortic stenosis. 3. One vessel coronary atherosclerosis. 4. Small hiatal hernia. 5. Mild sigmoid diverticulosis. 6. Aortic Atherosclerosis (ICD10-I70.0).  Electronically Signed: By: Ilona Sorrel M.D. On: 07/11/2020 05:19    Impression:  Patient has stage D1 severe symptomatic aortic stenosis. I have personally reviewed the patient's recent transthoracic echocardiogram, diagnostic cardiac catheterization, and CT angiograms. Echocardiogram reveals moderate thickening and restricted leaflet mobility involving all 3 leaflets of the patient's aortic valve. I suspect the patient may have a Sievers type I bicuspid aortic valve, although functional anatomy is not entirely clear. Peak velocity across the aortic valve measured 4.4 m/s corresponding to mean transvalvular gradient estimated greater than 44 mmHg and aortic valve area calculated 0.60  cm by VTI. Doppler velocity index was quite low at 0.19. Left ventricular systolic function remain normal with ejection fraction estimated 55 to 60%. There was mild left ventricular hypertrophy. Diagnostic cardiac catheterization confirmed the presence of severe aortic stenosis with peak to peak and mean transvalvular gradients measured 54 and 37.5 mmHg, respectively, corresponding to aortic valve area calculated 0.77 cm. Right heart pressures were normal and the patient did not have significant atherosclerotic coronary artery disease.  Gated CT angiogram of the heart confirmed the presence of Sievers type I bicuspid aortic valve with findings consistent with severe aortic stenosis.  The patient also was noted to have relatively small size aortic root and aortic annulus which might require aortic root enlargement or root replacement to avoid the development of patient prosthesis mismatch.    Plan:  The patient and his wife were again counseled at length regarding treatment alternatives for management of severe aortic stenosis including continued medical therapy versus proceeding with aortic valve replacement in the near future.  The natural history of aortic stenosis was reviewed, as was long term prognosis with medical therapy alone.  Surgical options were discussed at length including conventional surgical aortic valve replacement through either a full median sternotomy or using minimally invasive techniques.  Because of the presence of relatively small aortic root and aortic annulus with possible need for root enlargement or root replacement I would not recommend performing valve replacement via minithoracotomy approach.  Discussion was held comparing the relative risks of mechanical valve replacement with need for lifelong anticoagulation versus use of a bioprosthetic tissue valve and the associated potential for late structural valve  deterioration and failure.  This discussion was placed in the  context of the patient's particular circumstances, and as a result the patient specifically requests that their valve be replaced using a bioprosthetic tissue valve.    The patient understands and accepts all potential associated risks of surgery including but not limited to risk of death, stroke, myocardial infarction, congestive heart failure, respiratory failure, renal failure, pneumonia, bleeding requiring blood transfusion and or reexploration, arrhythmia, heart block or bradycardia requiring permanent pacemaker, aortic dissection or other major vascular complication, pleural effusions or other delayed complications related to continued congestive heart failure, and other late complications related to valve replacement including structural valve deterioration and failure, thrombosis, endocarditis, or paravalvular leak.  Patient is scheduled for surgery on July 29, 2020.  All questions answered.      Valentina Gu. Roxy Manns, MD 07/27/2020 4:17 PM

## 2020-07-29 ENCOUNTER — Inpatient Hospital Stay (HOSPITAL_COMMUNITY): Payer: Commercial Managed Care - PPO

## 2020-07-29 ENCOUNTER — Inpatient Hospital Stay (HOSPITAL_COMMUNITY)
Admission: RE | Admit: 2020-07-29 | Discharge: 2020-08-02 | DRG: 220 | Disposition: A | Discharge status: Home / Self Care | Payer: Commercial Managed Care - PPO | Attending: Thoracic Surgery (Cardiothoracic Vascular Surgery) | Admitting: Thoracic Surgery (Cardiothoracic Vascular Surgery)

## 2020-07-29 ENCOUNTER — Inpatient Hospital Stay (HOSPITAL_COMMUNITY): Payer: Commercial Managed Care - PPO | Admitting: Anesthesiology

## 2020-07-29 ENCOUNTER — Other Ambulatory Visit: Payer: Self-pay

## 2020-07-29 ENCOUNTER — Encounter (HOSPITAL_COMMUNITY): Payer: Self-pay | Admitting: Thoracic Surgery (Cardiothoracic Vascular Surgery)

## 2020-07-29 ENCOUNTER — Encounter (HOSPITAL_COMMUNITY)
Admission: RE | Disposition: A | Discharge status: Home / Self Care | Payer: Self-pay | Source: Home / Self Care | Attending: Thoracic Surgery (Cardiothoracic Vascular Surgery)

## 2020-07-29 DIAGNOSIS — Z79899 Other long term (current) drug therapy: Secondary | ICD-10-CM

## 2020-07-29 DIAGNOSIS — Q230 Congenital stenosis of aortic valve: Secondary | ICD-10-CM

## 2020-07-29 DIAGNOSIS — D62 Acute posthemorrhagic anemia: Secondary | ICD-10-CM | POA: Diagnosis not present

## 2020-07-29 DIAGNOSIS — I119 Hypertensive heart disease without heart failure: Secondary | ICD-10-CM | POA: Diagnosis present

## 2020-07-29 DIAGNOSIS — M1990 Unspecified osteoarthritis, unspecified site: Secondary | ICD-10-CM | POA: Diagnosis present

## 2020-07-29 DIAGNOSIS — Z953 Presence of xenogenic heart valve: Secondary | ICD-10-CM

## 2020-07-29 DIAGNOSIS — E785 Hyperlipidemia, unspecified: Secondary | ICD-10-CM | POA: Diagnosis present

## 2020-07-29 DIAGNOSIS — K219 Gastro-esophageal reflux disease without esophagitis: Secondary | ICD-10-CM | POA: Diagnosis present

## 2020-07-29 DIAGNOSIS — I350 Nonrheumatic aortic (valve) stenosis: Secondary | ICD-10-CM

## 2020-07-29 DIAGNOSIS — D696 Thrombocytopenia, unspecified: Secondary | ICD-10-CM | POA: Diagnosis not present

## 2020-07-29 DIAGNOSIS — Z886 Allergy status to analgesic agent status: Secondary | ICD-10-CM | POA: Diagnosis not present

## 2020-07-29 DIAGNOSIS — I10 Essential (primary) hypertension: Secondary | ICD-10-CM | POA: Diagnosis present

## 2020-07-29 DIAGNOSIS — Z20822 Contact with and (suspected) exposure to covid-19: Secondary | ICD-10-CM | POA: Diagnosis present

## 2020-07-29 DIAGNOSIS — Z006 Encounter for examination for normal comparison and control in clinical research program: Secondary | ICD-10-CM

## 2020-07-29 DIAGNOSIS — M109 Gout, unspecified: Secondary | ICD-10-CM | POA: Diagnosis present

## 2020-07-29 DIAGNOSIS — J9811 Atelectasis: Secondary | ICD-10-CM

## 2020-07-29 HISTORY — DX: Presence of xenogenic heart valve: Z95.3

## 2020-07-29 HISTORY — PX: AORTIC VALVE REPLACEMENT: SHX41

## 2020-07-29 HISTORY — PX: TEE WITHOUT CARDIOVERSION: SHX5443

## 2020-07-29 HISTORY — PX: AORTIC ROOT ENLARGEMENT: SHX6346

## 2020-07-29 LAB — ECHO INTRAOPERATIVE TEE
AR max vel: 1.04 cm2
AV Area VTI: 1.09 cm2
AV Area mean vel: 1.21 cm2
AV Mean grad: 21.5 mmHg
AV Peak grad: 39.8 mmHg
Ao pk vel: 3.15 m/s
Height: 66 in
Weight: 2368 oz

## 2020-07-29 LAB — POCT I-STAT 7, (LYTES, BLD GAS, ICA,H+H)
Acid-Base Excess: 1 mmol/L (ref 0.0–2.0)
Acid-Base Excess: 3 mmol/L — ABNORMAL HIGH (ref 0.0–2.0)
Acid-Base Excess: 5 mmol/L — ABNORMAL HIGH (ref 0.0–2.0)
Acid-Base Excess: 2 mmol/L (ref 0.0–2.0)
Acid-base deficit: 1 mmol/L (ref 0.0–2.0)
Acid-base deficit: 2 mmol/L (ref 0.0–2.0)
Acid-base deficit: 3 mmol/L — ABNORMAL HIGH (ref 0.0–2.0)
Bicarbonate: 26.9 mmol/L (ref 20.0–28.0)
Bicarbonate: 27.2 mmol/L (ref 20.0–28.0)
Bicarbonate: 27.4 mmol/L (ref 20.0–28.0)
Bicarbonate: 26.4 mmol/L (ref 20.0–28.0)
Bicarbonate: 24.4 mmol/L (ref 20.0–28.0)
Bicarbonate: 23.5 mmol/L (ref 20.0–28.0)
Bicarbonate: 22.8 mmol/L (ref 20.0–28.0)
Calcium, Ion: 1.29 mmol/L (ref 1.15–1.40)
Calcium, Ion: 1.03 mmol/L — ABNORMAL LOW (ref 1.15–1.40)
Calcium, Ion: 0.96 mmol/L — ABNORMAL LOW (ref 1.15–1.40)
Calcium, Ion: 1.3 mmol/L (ref 1.15–1.40)
Calcium, Ion: 1.26 mmol/L (ref 1.15–1.40)
Calcium, Ion: 1.22 mmol/L (ref 1.15–1.40)
Calcium, Ion: 1.23 mmol/L (ref 1.15–1.40)
HCT: 38 % — ABNORMAL LOW (ref 39.0–52.0)
HCT: 26 % — ABNORMAL LOW (ref 39.0–52.0)
HCT: 25 % — ABNORMAL LOW (ref 39.0–52.0)
HCT: 24 % — ABNORMAL LOW (ref 39.0–52.0)
HCT: 30 % — ABNORMAL LOW (ref 39.0–52.0)
HCT: 32 % — ABNORMAL LOW (ref 39.0–52.0)
HCT: 31 % — ABNORMAL LOW (ref 39.0–52.0)
Hemoglobin: 12.9 g/dL — ABNORMAL LOW (ref 13.0–17.0)
Hemoglobin: 8.8 g/dL — ABNORMAL LOW (ref 13.0–17.0)
Hemoglobin: 8.5 g/dL — ABNORMAL LOW (ref 13.0–17.0)
Hemoglobin: 8.2 g/dL — ABNORMAL LOW (ref 13.0–17.0)
Hemoglobin: 10.2 g/dL — ABNORMAL LOW (ref 13.0–17.0)
Hemoglobin: 10.9 g/dL — ABNORMAL LOW (ref 13.0–17.0)
Hemoglobin: 10.5 g/dL — ABNORMAL LOW (ref 13.0–17.0)
O2 Saturation: 100 %
O2 Saturation: 73 %
O2 Saturation: 100 %
O2 Saturation: 100 %
O2 Saturation: 99 %
O2 Saturation: 99 %
O2 Saturation: 98 %
Patient temperature: 36.8
Patient temperature: 37.5
Patient temperature: 37.8
Potassium: 4.2 mmol/L (ref 3.5–5.1)
Potassium: 4.3 mmol/L (ref 3.5–5.1)
Potassium: 5.8 mmol/L — ABNORMAL HIGH (ref 3.5–5.1)
Potassium: 4.6 mmol/L (ref 3.5–5.1)
Potassium: 4.4 mmol/L (ref 3.5–5.1)
Potassium: 4.8 mmol/L (ref 3.5–5.1)
Potassium: 4.8 mmol/L (ref 3.5–5.1)
Sodium: 142 mmol/L (ref 135–145)
Sodium: 145 mmol/L (ref 135–145)
Sodium: 141 mmol/L (ref 135–145)
Sodium: 142 mmol/L (ref 135–145)
Sodium: 142 mmol/L (ref 135–145)
Sodium: 142 mmol/L (ref 135–145)
Sodium: 141 mmol/L (ref 135–145)
TCO2: 28 mmol/L (ref 22–32)
TCO2: 28 mmol/L (ref 22–32)
TCO2: 28 mmol/L (ref 22–32)
TCO2: 28 mmol/L (ref 22–32)
TCO2: 26 mmol/L (ref 22–32)
TCO2: 25 mmol/L (ref 22–32)
TCO2: 24 mmol/L (ref 22–32)
pCO2 arterial: 46.2 mmHg (ref 32.0–48.0)
pCO2 arterial: 40.7 mmHg (ref 32.0–48.0)
pCO2 arterial: 32.6 mmHg (ref 32.0–48.0)
pCO2 arterial: 40.1 mmHg (ref 32.0–48.0)
pCO2 arterial: 40.5 mmHg (ref 32.0–48.0)
pCO2 arterial: 40.9 mmHg (ref 32.0–48.0)
pCO2 arterial: 44.7 mmHg (ref 32.0–48.0)
pH, Arterial: 7.373 (ref 7.350–7.450)
pH, Arterial: 7.433 (ref 7.350–7.450)
pH, Arterial: 7.533 — ABNORMAL HIGH (ref 7.350–7.450)
pH, Arterial: 7.427 (ref 7.350–7.450)
pH, Arterial: 7.388 (ref 7.350–7.450)
pH, Arterial: 7.369 (ref 7.350–7.450)
pH, Arterial: 7.32 — ABNORMAL LOW (ref 7.350–7.450)
pO2, Arterial: 223 mmHg — ABNORMAL HIGH (ref 83.0–108.0)
pO2, Arterial: 37 mmHg — CL (ref 83.0–108.0)
pO2, Arterial: 373 mmHg — ABNORMAL HIGH (ref 83.0–108.0)
pO2, Arterial: 263 mmHg — ABNORMAL HIGH (ref 83.0–108.0)
pO2, Arterial: 124 mmHg — ABNORMAL HIGH (ref 83.0–108.0)
pO2, Arterial: 130 mmHg — ABNORMAL HIGH (ref 83.0–108.0)
pO2, Arterial: 121 mmHg — ABNORMAL HIGH (ref 83.0–108.0)

## 2020-07-29 LAB — CBC
HCT: 32.7 % — ABNORMAL LOW (ref 39.0–52.0)
HCT: 32.5 % — ABNORMAL LOW (ref 39.0–52.0)
Hemoglobin: 11.2 g/dL — ABNORMAL LOW (ref 13.0–17.0)
Hemoglobin: 10.9 g/dL — ABNORMAL LOW (ref 13.0–17.0)
MCH: 32.4 pg (ref 26.0–34.0)
MCH: 32.2 pg (ref 26.0–34.0)
MCHC: 34.3 g/dL (ref 30.0–36.0)
MCHC: 33.5 g/dL (ref 30.0–36.0)
MCV: 94.5 fL (ref 80.0–100.0)
MCV: 95.9 fL (ref 80.0–100.0)
Platelets: 91 10*3/uL — ABNORMAL LOW (ref 150–400)
Platelets: 102 10*3/uL — ABNORMAL LOW (ref 150–400)
RBC: 3.46 MIL/uL — ABNORMAL LOW (ref 4.22–5.81)
RBC: 3.39 MIL/uL — ABNORMAL LOW (ref 4.22–5.81)
RDW: 12.9 % (ref 11.5–15.5)
RDW: 13.1 % (ref 11.5–15.5)
WBC: 9.1 10*3/uL (ref 4.0–10.5)
WBC: 10.4 10*3/uL (ref 4.0–10.5)
nRBC: 0 % (ref 0.0–0.2)
nRBC: 0 % (ref 0.0–0.2)

## 2020-07-29 LAB — COMPREHENSIVE METABOLIC PANEL
ALT: 33 U/L (ref 0–44)
AST: 75 U/L — ABNORMAL HIGH (ref 15–41)
Albumin: 3.3 g/dL — ABNORMAL LOW (ref 3.5–5.0)
Alkaline Phosphatase: 36 U/L — ABNORMAL LOW (ref 38–126)
Anion gap: 5 (ref 5–15)
BUN: 8 mg/dL (ref 6–20)
CO2: 22 mmol/L (ref 22–32)
Calcium: 7.9 mg/dL — ABNORMAL LOW (ref 8.9–10.3)
Chloride: 112 mmol/L — ABNORMAL HIGH (ref 98–111)
Creatinine, Ser: 1.04 mg/dL (ref 0.61–1.24)
GFR, Estimated: >60 mL/min (ref >60)
Glucose, Bld: 123 mg/dL — ABNORMAL HIGH (ref 70–99)
Potassium: 4.3 mmol/L (ref 3.5–5.1)
Sodium: 139 mmol/L (ref 135–145)
Total Bilirubin: 1.1 mg/dL (ref 0.3–1.2)
Total Protein: 4.9 g/dL — ABNORMAL LOW (ref 6.5–8.1)

## 2020-07-29 LAB — POCT I-STAT, CHEM 8
BUN: 8 mg/dL (ref 6–20)
BUN: 7 mg/dL (ref 6–20)
BUN: 7 mg/dL (ref 6–20)
BUN: 7 mg/dL (ref 6–20)
BUN: 7 mg/dL (ref 6–20)
Calcium, Ion: 1.26 mmol/L (ref 1.15–1.40)
Calcium, Ion: 1.28 mmol/L (ref 1.15–1.40)
Calcium, Ion: 1.01 mmol/L — ABNORMAL LOW (ref 1.15–1.40)
Calcium, Ion: 1.07 mmol/L — ABNORMAL LOW (ref 1.15–1.40)
Calcium, Ion: 1.13 mmol/L — ABNORMAL LOW (ref 1.15–1.40)
Chloride: 105 mmol/L (ref 98–111)
Chloride: 106 mmol/L (ref 98–111)
Chloride: 104 mmol/L (ref 98–111)
Chloride: 105 mmol/L (ref 98–111)
Chloride: 106 mmol/L (ref 98–111)
Creatinine, Ser: 0.9 mg/dL (ref 0.61–1.24)
Creatinine, Ser: 0.8 mg/dL (ref 0.61–1.24)
Creatinine, Ser: 0.6 mg/dL — ABNORMAL LOW (ref 0.61–1.24)
Creatinine, Ser: 0.7 mg/dL (ref 0.61–1.24)
Creatinine, Ser: 0.7 mg/dL (ref 0.61–1.24)
Glucose, Bld: 102 mg/dL — ABNORMAL HIGH (ref 70–99)
Glucose, Bld: 106 mg/dL — ABNORMAL HIGH (ref 70–99)
Glucose, Bld: 105 mg/dL — ABNORMAL HIGH (ref 70–99)
Glucose, Bld: 139 mg/dL — ABNORMAL HIGH (ref 70–99)
Glucose, Bld: 150 mg/dL — ABNORMAL HIGH (ref 70–99)
HCT: 37 % — ABNORMAL LOW (ref 39.0–52.0)
HCT: 33 % — ABNORMAL LOW (ref 39.0–52.0)
HCT: 26 % — ABNORMAL LOW (ref 39.0–52.0)
HCT: 27 % — ABNORMAL LOW (ref 39.0–52.0)
HCT: 28 % — ABNORMAL LOW (ref 39.0–52.0)
Hemoglobin: 12.6 g/dL — ABNORMAL LOW (ref 13.0–17.0)
Hemoglobin: 11.2 g/dL — ABNORMAL LOW (ref 13.0–17.0)
Hemoglobin: 8.8 g/dL — ABNORMAL LOW (ref 13.0–17.0)
Hemoglobin: 9.2 g/dL — ABNORMAL LOW (ref 13.0–17.0)
Hemoglobin: 9.5 g/dL — ABNORMAL LOW (ref 13.0–17.0)
Potassium: 4.2 mmol/L (ref 3.5–5.1)
Potassium: 4.4 mmol/L (ref 3.5–5.1)
Potassium: 5.6 mmol/L — ABNORMAL HIGH (ref 3.5–5.1)
Potassium: 5.6 mmol/L — ABNORMAL HIGH (ref 3.5–5.1)
Potassium: 5.7 mmol/L — ABNORMAL HIGH (ref 3.5–5.1)
Sodium: 142 mmol/L (ref 135–145)
Sodium: 142 mmol/L (ref 135–145)
Sodium: 138 mmol/L (ref 135–145)
Sodium: 138 mmol/L (ref 135–145)
Sodium: 138 mmol/L (ref 135–145)
TCO2: 27 mmol/L (ref 22–32)
TCO2: 27 mmol/L (ref 22–32)
TCO2: 28 mmol/L (ref 22–32)
TCO2: 28 mmol/L (ref 22–32)
TCO2: 29 mmol/L (ref 22–32)

## 2020-07-29 LAB — PROTIME-INR
INR: 1.3 — ABNORMAL HIGH (ref 0.8–1.2)
Prothrombin Time: 16.3 seconds — ABNORMAL HIGH (ref 11.4–15.2)

## 2020-07-29 LAB — HEMOGLOBIN AND HEMATOCRIT, BLOOD
HCT: 28.2 % — ABNORMAL LOW (ref 39.0–52.0)
Hemoglobin: 9.6 g/dL — ABNORMAL LOW (ref 13.0–17.0)

## 2020-07-29 LAB — ABO/RH: ABO/RH(D): B POS

## 2020-07-29 LAB — PREPARE RBC (CROSSMATCH)

## 2020-07-29 LAB — GLUCOSE, CAPILLARY
Glucose-Capillary: 124 mg/dL — ABNORMAL HIGH (ref 70–99)
Glucose-Capillary: 145 mg/dL — ABNORMAL HIGH (ref 70–99)
Glucose-Capillary: 131 mg/dL — ABNORMAL HIGH (ref 70–99)
Glucose-Capillary: 129 mg/dL — ABNORMAL HIGH (ref 70–99)
Glucose-Capillary: 133 mg/dL — ABNORMAL HIGH (ref 70–99)
Glucose-Capillary: 164 mg/dL — ABNORMAL HIGH (ref 70–99)
Glucose-Capillary: 146 mg/dL — ABNORMAL HIGH (ref 70–99)

## 2020-07-29 LAB — APTT: aPTT: 33 seconds (ref 24–36)

## 2020-07-29 LAB — PHOSPHORUS: Phosphorus: 2.8 mg/dL (ref 2.5–4.6)

## 2020-07-29 LAB — PLATELET COUNT: Platelets: 120 10*3/uL — ABNORMAL LOW (ref 150–400)

## 2020-07-29 LAB — MAGNESIUM: Magnesium: 2.7 mg/dL — ABNORMAL HIGH (ref 1.7–2.4)

## 2020-07-29 SURGERY — REPLACEMENT, AORTIC VALVE, OPEN
Anesthesia: General | Site: Chest

## 2020-07-29 MED ORDER — PLASMA-LYTE 148 IV SOLN
INTRAVENOUS | Status: DC | PRN
  Administered 2020-07-29: 100 mL via INTRAVASCULAR

## 2020-07-29 MED ORDER — ROCURONIUM BROMIDE 10 MG/ML (PF) SYRINGE
PREFILLED_SYRINGE | INTRAVENOUS | Status: DC | PRN
  Administered 2020-07-29: 50 mg via INTRAVENOUS
  Administered 2020-07-29: 60 mg via INTRAVENOUS
  Administered 2020-07-29: 50 mg via INTRAVENOUS
  Administered 2020-07-29: 40 mg via INTRAVENOUS

## 2020-07-29 MED ORDER — HEPARIN SODIUM (PORCINE) 1000 UNIT/ML IJ SOLN
INTRAMUSCULAR | Status: AC
  Filled 2020-07-29: qty 1

## 2020-07-29 MED ORDER — EPHEDRINE 5 MG/ML INJ
INTRAVENOUS | Status: AC
  Filled 2020-07-29: qty 10

## 2020-07-29 MED ORDER — MIDAZOLAM HCL 2 MG/2ML IJ SOLN: 2.0000 mg | INTRAMUSCULAR | Status: DC | PRN

## 2020-07-29 MED ORDER — METOPROLOL TARTRATE 12.5 MG HALF TABLET
12.5000 mg | ORAL_TABLET | Freq: Once | ORAL | Status: AC
  Administered 2020-07-29: 12.5 mg via ORAL
  Filled 2020-07-29: qty 1

## 2020-07-29 MED ORDER — ONDANSETRON HCL 4 MG/2ML IJ SOLN
4.0000 mg | Freq: Four times a day (QID) | INTRAMUSCULAR | Status: DC | PRN
  Administered 2020-07-29: 4 mg via INTRAVENOUS
  Filled 2020-07-29: qty 2

## 2020-07-29 MED ORDER — FENTANYL CITRATE (PF) 250 MCG/5ML IJ SOLN
INTRAMUSCULAR | Status: AC
  Filled 2020-07-29: qty 10

## 2020-07-29 MED ORDER — ROCURONIUM BROMIDE 10 MG/ML (PF) SYRINGE
PREFILLED_SYRINGE | INTRAVENOUS | Status: AC
  Filled 2020-07-29: qty 20

## 2020-07-29 MED ORDER — DEXTROSE 50 % IV SOLN: 0.0000 mL | INTRAVENOUS | Status: DC | PRN

## 2020-07-29 MED ORDER — MIDAZOLAM HCL 5 MG/5ML IJ SOLN
INTRAMUSCULAR | Status: DC | PRN
  Administered 2020-07-29 (×2): 2 mg via INTRAVENOUS
  Administered 2020-07-29: 1 mg via INTRAVENOUS
  Administered 2020-07-29: 2 mg via INTRAVENOUS
  Administered 2020-07-29: 1 mg via INTRAVENOUS
  Administered 2020-07-29: 2 mg via INTRAVENOUS

## 2020-07-29 MED ORDER — BISACODYL 5 MG PO TBEC
10.0000 mg | DELAYED_RELEASE_TABLET | Freq: Every day | ORAL | Status: DC
  Administered 2020-07-30 – 2020-08-01 (×3): 10 mg via ORAL
  Filled 2020-07-29 (×4): qty 2

## 2020-07-29 MED ORDER — SODIUM CHLORIDE (PF) 0.9 % IJ SOLN
OROMUCOSAL | Status: DC | PRN
  Administered 2020-07-29 (×4): 4 mL via TOPICAL

## 2020-07-29 MED ORDER — ACETAMINOPHEN 650 MG RE SUPP
650.0000 mg | Freq: Once | RECTAL | Status: AC
  Administered 2020-07-29: 650 mg via RECTAL

## 2020-07-29 MED ORDER — LACTATED RINGERS IV SOLN: INTRAVENOUS | Status: DC | PRN

## 2020-07-29 MED ORDER — MAGNESIUM SULFATE 4 GM/100ML IV SOLN
4.0000 g | Freq: Once | INTRAVENOUS | Status: AC
  Administered 2020-07-29: 4 g via INTRAVENOUS
  Filled 2020-07-29: qty 100

## 2020-07-29 MED ORDER — ONDANSETRON HCL 4 MG/2ML IJ SOLN
INTRAMUSCULAR | Status: AC
  Filled 2020-07-29: qty 4

## 2020-07-29 MED ORDER — PANTOPRAZOLE SODIUM 40 MG PO TBEC
40.0000 mg | DELAYED_RELEASE_TABLET | Freq: Every day | ORAL | Status: DC
  Administered 2020-07-31 – 2020-08-02 (×3): 40 mg via ORAL
  Filled 2020-07-29 (×3): qty 1

## 2020-07-29 MED ORDER — ACETAMINOPHEN 160 MG/5ML PO SOLN: 1000.0000 mg | Freq: Four times a day (QID) | ORAL | Status: DC

## 2020-07-29 MED ORDER — CHLORHEXIDINE GLUCONATE 0.12 % MT SOLN: 15.0000 mL | Freq: Once | OROMUCOSAL | Status: DC

## 2020-07-29 MED ORDER — LACTATED RINGERS IV SOLN: INTRAVENOUS | Status: DC

## 2020-07-29 MED ORDER — ALBUMIN HUMAN 5 % IV SOLN
250.0000 mL | INTRAVENOUS | Status: DC | PRN
  Administered 2020-07-30 (×3): 12.5 g via INTRAVENOUS
  Filled 2020-07-29 (×2): qty 250

## 2020-07-29 MED ORDER — DOCUSATE SODIUM 100 MG PO CAPS
200.0000 mg | ORAL_CAPSULE | Freq: Every day | ORAL | Status: DC
  Administered 2020-07-30 – 2020-08-01 (×3): 200 mg via ORAL
  Filled 2020-07-29 (×4): qty 2

## 2020-07-29 MED ORDER — SODIUM CHLORIDE 0.9 % IV SOLN: INTRAVENOUS | Status: DC

## 2020-07-29 MED ORDER — ASPIRIN 81 MG PO CHEW: 324.0000 mg | CHEWABLE_TABLET | Freq: Every day | ORAL | Status: DC

## 2020-07-29 MED ORDER — VANCOMYCIN HCL IN DEXTROSE 1-5 GM/200ML-% IV SOLN
1000.0000 mg | Freq: Once | INTRAVENOUS | Status: AC
Start: 2020-07-29 — End: 2020-07-29
  Administered 2020-07-29: 1000 mg via INTRAVENOUS
  Filled 2020-07-29: qty 200

## 2020-07-29 MED ORDER — ORAL CARE MOUTH RINSE: 15.0000 mL | Freq: Once | OROMUCOSAL | Status: AC

## 2020-07-29 MED ORDER — SODIUM CHLORIDE 0.9 % IV SOLN: 250.0000 mL | INTRAVENOUS | Status: DC

## 2020-07-29 MED ORDER — PROTAMINE SULFATE 10 MG/ML IV SOLN
INTRAVENOUS | Status: DC | PRN
  Administered 2020-07-29: 230 mg via INTRAVENOUS

## 2020-07-29 MED ORDER — SODIUM CHLORIDE 0.45 % IV SOLN: INTRAVENOUS | Status: DC | PRN

## 2020-07-29 MED ORDER — PHENYLEPHRINE HCL (PRESSORS) 10 MG/ML IV SOLN
INTRAVENOUS | Status: DC | PRN
  Administered 2020-07-29: 40 ug via INTRAVENOUS

## 2020-07-29 MED ORDER — NITROGLYCERIN IN D5W 200-5 MCG/ML-% IV SOLN: 0.0000 ug/min | INTRAVENOUS | Status: DC

## 2020-07-29 MED ORDER — SODIUM CHLORIDE 0.9% FLUSH
3.0000 mL | Freq: Two times a day (BID) | INTRAVENOUS | Status: DC
  Administered 2020-07-30 – 2020-08-01 (×6): 3 mL via INTRAVENOUS

## 2020-07-29 MED ORDER — BISACODYL 10 MG RE SUPP: 10.0000 mg | Freq: Every day | RECTAL | Status: DC

## 2020-07-29 MED ORDER — PROTAMINE SULFATE 10 MG/ML IV SOLN
INTRAVENOUS | Status: AC
  Filled 2020-07-29: qty 25

## 2020-07-29 MED ORDER — PHENYLEPHRINE 40 MCG/ML (10ML) SYRINGE FOR IV PUSH (FOR BLOOD PRESSURE SUPPORT)
PREFILLED_SYRINGE | INTRAVENOUS | Status: AC
  Filled 2020-07-29: qty 20

## 2020-07-29 MED ORDER — HEMOSTATIC AGENTS (NO CHARGE) OPTIME
TOPICAL | Status: DC | PRN
  Administered 2020-07-29: 1 via TOPICAL

## 2020-07-29 MED ORDER — METOPROLOL TARTRATE 5 MG/5ML IV SOLN
2.5000 mg | INTRAVENOUS | Status: DC | PRN
  Administered 2020-07-30: 2.5 mg via INTRAVENOUS
  Filled 2020-07-29: qty 5

## 2020-07-29 MED ORDER — OXYCODONE HCL 5 MG PO TABS: 5.0000 mg | ORAL_TABLET | ORAL | Status: DC | PRN

## 2020-07-29 MED ORDER — CHLORHEXIDINE GLUCONATE 0.12 % MT SOLN
15.0000 mL | Freq: Once | OROMUCOSAL | Status: AC
  Administered 2020-07-29: 15 mL via OROMUCOSAL
  Filled 2020-07-29: qty 15

## 2020-07-29 MED ORDER — FAMOTIDINE IN NACL 20-0.9 MG/50ML-% IV SOLN
20.0000 mg | Freq: Two times a day (BID) | INTRAVENOUS | Status: DC
  Administered 2020-07-29: 20 mg via INTRAVENOUS

## 2020-07-29 MED ORDER — CHLORHEXIDINE GLUCONATE 0.12 % MT SOLN
15.0000 mL | OROMUCOSAL | Status: AC
  Administered 2020-07-29: 15 mL via OROMUCOSAL

## 2020-07-29 MED ORDER — LACTATED RINGERS IV SOLN: 500.0000 mL | Freq: Once | INTRAVENOUS | Status: DC | PRN

## 2020-07-29 MED ORDER — INSULIN REGULAR(HUMAN) IN NACL 100-0.9 UT/100ML-% IV SOLN: INTRAVENOUS | Status: DC

## 2020-07-29 MED ORDER — SODIUM CHLORIDE 0.9 % IV SOLN
1.5000 g | Freq: Two times a day (BID) | INTRAVENOUS | Status: AC
  Administered 2020-07-29 – 2020-07-31 (×4): 1.5 g via INTRAVENOUS
  Filled 2020-07-29 (×3): qty 1.5

## 2020-07-29 MED ORDER — ALBUMIN HUMAN 5 % IV SOLN: INTRAVENOUS | Status: DC | PRN

## 2020-07-29 MED ORDER — FENTANYL CITRATE (PF) 250 MCG/5ML IJ SOLN
INTRAMUSCULAR | Status: AC
  Filled 2020-07-29: qty 15

## 2020-07-29 MED ORDER — PHENYLEPHRINE HCL-NACL 20-0.9 MG/250ML-% IV SOLN: 0.0000 ug/min | INTRAVENOUS | Status: DC

## 2020-07-29 MED ORDER — ALLOPURINOL 300 MG PO TABS
300.0000 mg | ORAL_TABLET | Freq: Every day | ORAL | Status: DC
  Administered 2020-07-30 – 2020-08-02 (×4): 300 mg via ORAL
  Filled 2020-07-29 (×4): qty 1

## 2020-07-29 MED ORDER — FENTANYL CITRATE (PF) 250 MCG/5ML IJ SOLN
INTRAMUSCULAR | Status: DC | PRN
  Administered 2020-07-29 (×2): 50 ug via INTRAVENOUS
  Administered 2020-07-29 (×3): 100 ug via INTRAVENOUS
  Administered 2020-07-29: 150 ug via INTRAVENOUS
  Administered 2020-07-29: 100 ug via INTRAVENOUS
  Administered 2020-07-29: 50 ug via INTRAVENOUS
  Administered 2020-07-29 (×2): 150 ug via INTRAVENOUS
  Administered 2020-07-29: 50 ug via INTRAVENOUS
  Administered 2020-07-29: 100 ug via INTRAVENOUS

## 2020-07-29 MED ORDER — TRAMADOL HCL 50 MG PO TABS: 50.0000 mg | ORAL_TABLET | ORAL | Status: DC | PRN

## 2020-07-29 MED ORDER — CHLORHEXIDINE GLUCONATE CLOTH 2 % EX PADS
6.0000 | MEDICATED_PAD | Freq: Every day | CUTANEOUS | Status: DC
  Administered 2020-07-29 – 2020-07-31 (×3): 6 via TOPICAL

## 2020-07-29 MED ORDER — ASPIRIN EC 325 MG PO TBEC
325.0000 mg | DELAYED_RELEASE_TABLET | Freq: Every day | ORAL | Status: DC
  Administered 2020-07-30 – 2020-08-02 (×4): 325 mg via ORAL
  Filled 2020-07-29 (×4): qty 1

## 2020-07-29 MED ORDER — MIDAZOLAM HCL (PF) 10 MG/2ML IJ SOLN
INTRAMUSCULAR | Status: AC
  Filled 2020-07-29: qty 2

## 2020-07-29 MED ORDER — MORPHINE SULFATE (PF) 2 MG/ML IV SOLN
1.0000 mg | INTRAVENOUS | Status: DC | PRN
  Administered 2020-07-29: 2 mg via INTRAVENOUS
  Filled 2020-07-29: qty 1

## 2020-07-29 MED ORDER — SODIUM CHLORIDE 0.9% IV SOLUTION: Freq: Once | INTRAVENOUS | Status: DC

## 2020-07-29 MED ORDER — VANCOMYCIN HCL 1000 MG IV SOLR
INTRAVENOUS | Status: DC | PRN
  Administered 2020-07-29: 800 mL

## 2020-07-29 MED ORDER — PROPOFOL 10 MG/ML IV BOLUS
INTRAVENOUS | Status: DC | PRN
  Administered 2020-07-29: 100 mg via INTRAVENOUS

## 2020-07-29 MED ORDER — SODIUM CHLORIDE 0.9% FLUSH: 3.0000 mL | INTRAVENOUS | Status: DC | PRN

## 2020-07-29 MED ORDER — DEXMEDETOMIDINE HCL IN NACL 400 MCG/100ML IV SOLN
0.0000 ug/kg/h | INTRAVENOUS | Status: DC
  Administered 2020-07-29: 0.1 ug/kg/h via INTRAVENOUS
  Filled 2020-07-29: qty 100

## 2020-07-29 MED ORDER — PROPOFOL 10 MG/ML IV BOLUS
INTRAVENOUS | Status: AC
  Filled 2020-07-29: qty 40

## 2020-07-29 MED ORDER — ACETAMINOPHEN 500 MG PO TABS
1000.0000 mg | ORAL_TABLET | Freq: Four times a day (QID) | ORAL | Status: DC
  Administered 2020-07-29 – 2020-08-02 (×12): 1000 mg via ORAL
  Filled 2020-07-29 (×13): qty 2

## 2020-07-29 MED ORDER — HEPARIN SODIUM (PORCINE) 1000 UNIT/ML IJ SOLN
INTRAMUSCULAR | Status: DC | PRN
  Administered 2020-07-29: 23000 [IU] via INTRAVENOUS

## 2020-07-29 MED ORDER — ACETAMINOPHEN 160 MG/5ML PO SOLN: 650.0000 mg | Freq: Once | ORAL | Status: AC

## 2020-07-29 MED ORDER — SODIUM CHLORIDE 0.9 % IV SOLN: INTRAVENOUS | Status: DC | PRN

## 2020-07-29 MED ORDER — CHLORHEXIDINE GLUCONATE 4 % EX LIQD: 30.0000 mL | CUTANEOUS | Status: DC

## 2020-07-29 MED ORDER — POTASSIUM CHLORIDE 10 MEQ/50ML IV SOLN: 10.0000 meq | INTRAVENOUS | Status: AC

## 2020-07-29 SURGICAL SUPPLY — 87 items
ADAPTER CARDIO PERF ANTE/RETRO (ADAPTER) ×4 IMPLANT
ADPR PRFSN 84XANTGRD RTRGD (ADAPTER) ×3
BAG DECANTER FOR FLEXI CONT (MISCELLANEOUS) ×4 IMPLANT
BLADE CLIPPER SURG (BLADE) ×4 IMPLANT
CANISTER SUCT 3000ML PPV (MISCELLANEOUS) ×4 IMPLANT
CANNULA GUNDRY RCSP 15FR (MISCELLANEOUS) ×4 IMPLANT
CANNULA OPTISITE PERFUSION 16F (CANNULA) IMPLANT
CANNULA OPTISITE PERFUSION 18F (CANNULA) ×2 IMPLANT
CANNULA SUMP PERICARDIAL (CANNULA) ×3 IMPLANT
CATH CPB KIT OWEN (MISCELLANEOUS) IMPLANT
CATH ENDOVENT PULMONARY (CATHETERS) IMPLANT
CATH HEART VENT LEFT (CATHETERS) ×3 IMPLANT
CNTNR URN SCR LID CUP LEK RST (MISCELLANEOUS) ×3 IMPLANT
CONT SPEC 4OZ STRL OR WHT (MISCELLANEOUS) ×4
COVER BACK TABLE 24X17X13 BIG (DRAPES) ×4 IMPLANT
DEVICE SUT CK QUICK LOAD INDV (Prosthesis & Implant Heart) ×3 IMPLANT
DEVICE SUT CK QUICK LOAD MINI (Prosthesis & Implant Heart) ×1 IMPLANT
DRAPE CV SPLIT W-CLR ANES SCRN (DRAPES) ×4 IMPLANT
DRAPE INCISE IOBAN 66X45 STRL (DRAPES) ×8 IMPLANT
DRAPE PERI GROIN 82X75IN TIB (DRAPES) ×4 IMPLANT
DRSG AQUACEL AG ADV 3.5X14 (GAUZE/BANDAGES/DRESSINGS) ×2 IMPLANT
ELECT REM PT RETURN 9FT ADLT (ELECTROSURGICAL) ×8
ELECTRODE REM PT RTRN 9FT ADLT (ELECTROSURGICAL) ×6 IMPLANT
FELT TEFLON 1X6 (MISCELLANEOUS) ×7 IMPLANT
FEMORAL VENOUS CANN RAP (CANNULA) IMPLANT
FIBERTAPE STERNAL CLSR 2 36IN (SUTURE) ×6 IMPLANT
FIBERTAPE STERNAL CLSR 2X36 (SUTURE) ×8 IMPLANT
GAUZE SPONGE 4X4 12PLY STRL (GAUZE/BANDAGES/DRESSINGS) ×4 IMPLANT
GLOVE ORTHO TXT STRL SZ7.5 (GLOVE) ×10 IMPLANT
GLOVE SURG MICRO LTX SZ6.5 (GLOVE) ×8 IMPLANT
GOWN STRL REUS W/ TWL LRG LVL3 (GOWN DISPOSABLE) ×12 IMPLANT
GOWN STRL REUS W/TWL LRG LVL3 (GOWN DISPOSABLE) ×40
IV NS IRRIG 3000ML ARTHROMATIC (IV SOLUTION) ×1 IMPLANT
KIT BASIN OR (CUSTOM PROCEDURE TRAY) ×4 IMPLANT
KIT DILATOR VASC 18G NDL (KITS) ×4 IMPLANT
KIT SUCTION CATH 14FR (SUCTIONS) ×10 IMPLANT
KIT SUT CK MINI COMBO 4X17 (Prosthesis & Implant Heart) ×1 IMPLANT
KIT TURNOVER KIT B (KITS) ×4 IMPLANT
LEAD PACING MYOCARDI (MISCELLANEOUS) ×4 IMPLANT
LINE VENT (MISCELLANEOUS) ×2 IMPLANT
NDL SUT PASSING CERCLAG MED (SUTURE) IMPLANT
NDL SUT PASSING CERCLAGE MED (SUTURE) ×8
NEEDLE SUT PASSING CERCLAG MED (SUTURE) ×6 IMPLANT
NS IRRIG 1000ML POUR BTL (IV SOLUTION) ×18 IMPLANT
PACK E OPEN HEART (SUTURE) ×2 IMPLANT
PACK OPEN HEART (CUSTOM PROCEDURE TRAY) ×4 IMPLANT
PAD ARMBOARD 7.5X6 YLW CONV (MISCELLANEOUS) ×8 IMPLANT
PAD ELECT DEFIB RADIOL ZOLL (MISCELLANEOUS) ×4 IMPLANT
POSITIONER HEAD DONUT 9IN (MISCELLANEOUS) ×4 IMPLANT
SEALANT SURG COSEAL 8ML (VASCULAR PRODUCTS) ×3 IMPLANT
SENSOR MYOCARDIAL TEMP (MISCELLANEOUS) ×3 IMPLANT
SET CARDIOPLEGIA MPS 5001102 (MISCELLANEOUS) ×1 IMPLANT
SET IRRIG TUBING LAPAROSCOPIC (IRRIGATION / IRRIGATOR) ×2 IMPLANT
SOL ANTI FOG 6CC (MISCELLANEOUS) ×3 IMPLANT
SOLUTION ANTI FOG 6CC (MISCELLANEOUS) ×1
STRIP PERIGUARD 6X8 (Vascular Products) ×1 IMPLANT
SUT BONE WAX W31G (SUTURE) ×4 IMPLANT
SUT EB EXC GRN/WHT 2-0 V-5 (SUTURE) IMPLANT
SUT ETHIBON EXCEL 2-0 V-5 (SUTURE) ×22 IMPLANT
SUT ETHIBOND V-5 VALVE (SUTURE) ×11 IMPLANT
SUT ETHIBOND X763 2 0 SH 1 (SUTURE) ×3 IMPLANT
SUT GORETEX CV 4 TH 22 36 (SUTURE) IMPLANT
SUT GORETEX CV4 TH-18 (SUTURE) IMPLANT
SUT MNCRL AB 3-0 PS2 27 (SUTURE) ×4 IMPLANT
SUT PROLENE 3 0 SH 1 (SUTURE) ×2 IMPLANT
SUT PROLENE 3 0 SH DA (SUTURE) ×6 IMPLANT
SUT PROLENE 3 0 SH1 36 (SUTURE) ×10 IMPLANT
SUT PROLENE 4 0 RB 1 (SUTURE) ×20
SUT PROLENE 4 0 SH DA (SUTURE) ×2 IMPLANT
SUT PROLENE 4-0 RB1 .5 CRCL 36 (SUTURE) ×5 IMPLANT
SUT SILK  1 MH (SUTURE) ×4
SUT SILK 1 MH (SUTURE) ×1 IMPLANT
SYSTEM SAHARA CHEST DRAIN ATS (WOUND CARE) ×4 IMPLANT
TAPE CLOTH SURG 4X10 WHT LF (GAUZE/BANDAGES/DRESSINGS) ×1 IMPLANT
TAPE PAPER 2X10 WHT MICROPORE (GAUZE/BANDAGES/DRESSINGS) ×1 IMPLANT
TOWEL GREEN STERILE (TOWEL DISPOSABLE) ×4 IMPLANT
TOWEL GREEN STERILE FF (TOWEL DISPOSABLE) ×4 IMPLANT
TRAY FOLEY SLVR 16FR TEMP STAT (SET/KITS/TRAYS/PACK) ×4 IMPLANT
TROCAR XCEL BLADELESS 5X75MML (TROCAR) IMPLANT
TUBE CONNECTING 12X1/4 (SUCTIONS) ×2 IMPLANT
TUBE CONNECTING 20X1/4 (TUBING) ×1 IMPLANT
TUBE SUCT INTRACARD DLP 20F (MISCELLANEOUS) ×4 IMPLANT
UNDERPAD 30X36 HEAVY ABSORB (UNDERPADS AND DIAPERS) ×4 IMPLANT
VALVE AORTIC SZ21 INSP/RESIL (Valve) ×1 IMPLANT
VENT LEFT HEART 12002 (CATHETERS) ×4
WATER STERILE IRR 1000ML POUR (IV SOLUTION) ×8 IMPLANT
YANKAUER SUCT BULB TIP NO VENT (SUCTIONS) ×2 IMPLANT

## 2020-07-29 NOTE — Anesthesia Preprocedure Evaluation (Signed)
Anesthesia Evaluation  Patient identified by MRN, date of birth, ID band Patient awake    Reviewed: Allergy & Precautions, NPO status , Patient's Chart, lab work & pertinent test results  Airway Mallampati: II  TM Distance: >3 FB Neck ROM: Full    Dental  (+) Teeth Intact, Dental Advisory Given   Pulmonary    breath sounds clear to auscultation       Cardiovascular hypertension,  Rhythm:Regular Rate:Normal     Neuro/Psych    GI/Hepatic   Endo/Other    Renal/GU      Musculoskeletal   Abdominal   Peds  Hematology   Anesthesia Other Findings   Reproductive/Obstetrics                             Anesthesia Physical Anesthesia Plan  ASA: III  Anesthesia Plan: General   Post-op Pain Management:    Induction: Intravenous  PONV Risk Score and Plan: Ondansetron and Dexamethasone  Airway Management Planned: Oral ETT  Additional Equipment: Arterial line, PA Cath, 3D TEE and Ultrasound Guidance Line Placement  Intra-op Plan:   Post-operative Plan: Post-operative intubation/ventilation  Informed Consent: I have reviewed the patients History and Physical, chart, labs and discussed the procedure including the risks, benefits and alternatives for the proposed anesthesia with the patient or authorized representative who has indicated his/her understanding and acceptance.     Dental advisory given  Plan Discussed with: CRNA and Anesthesiologist  Anesthesia Plan Comments:         Anesthesia Quick Evaluation

## 2020-07-29 NOTE — Transfer of Care (Signed)
Immediate Anesthesia Transfer of Care Note  Patient: William Welch  Procedure(s) Performed: AORTIC VALVE REPLACEMENT (AVR) USING 21MM INSPIRIS RESILIA  AORTIC VALVE (N/A Chest) TRANSESOPHAGEAL ECHOCARDIOGRAM (TEE) (N/A ) AORTIC ROOT ENLARGEMENT USING 6CMX8CM PERI-GUARD PERICARDIAL PATCH  Patient Location: ICU  Anesthesia Type:General  Level of Consciousness: Patient remains intubated per anesthesia plan  Airway & Oxygen Therapy: Patient remains intubated per anesthesia plan and Patient placed on Ventilator (see vital sign flow sheet for setting)  Post-op Assessment: Report given to RN and Post -op Vital signs reviewed and stable  Post vital signs: Reviewed and stable  Last Vitals:  Vitals Value Taken Time  BP    Temp 36.7 C 07/29/20 1437  Pulse 89 07/29/20 1437  Resp 12 07/29/20 1437  SpO2 100 % 07/29/20 1437  Vitals shown include unvalidated device data.  Last Pain:  Vitals:   07/29/20 0712  TempSrc: Oral  PainSc:          Complications: No complications documented.

## 2020-07-29 NOTE — Procedures (Signed)
Extubation Procedure Note  Patient Details:   Name: William Welch DOB: [birth year] MRN: [id number]   Airway Documentation:    Vent end date: 07/29/20 Vent end time: 1640   Evaluation  O2 sats: stable throughout Complications: No apparent complications Patient did tolerate procedure well. Bilateral Breath Sounds: Diminished,Clear   Yes   RT extubated patient to 4L [place] per MD order/rapid wean protocol with RN at bedside. Positive cuff leak noted. VC 900 and NIF -25. Patient tolerated well and no stridor noted at this time. RT will continue to monitor as needed.   Fabiola Backer 07/29/2020, 4:48 PM

## 2020-07-29 NOTE — Hospital Course (Addendum)
HPI: This is a 55 year old male with a past medical history of  GE reflux disease, gout, hypertension, hyperlipidemia, and known history of heart murmur who has been referred for surgical consultation for management of recently discovered severe symptomatic aortic stenosis. Patient states that he was told he had a heart murmur many years ago.  He never had it formally evaluated until recently.  Over the past 2 to 3 months he has developed symptoms of exertional shortness of breath and chest tightness.  He was seen by his primary care physician who noted a prominent systolic murmur on physical exam.  He was referred for cardiology evaluation and was seen by Dr. Johney Frame on June 16, 2020.  Echocardiogram performed June 19, 2020 revealed severe aortic stenosis with normal left ventricular systolic function.  The patient was referred to the multidisciplinary heart valve clinic and underwent diagnostic cardiac catheterization by Dr. Angelena Form on July 01, 2020.  Catheterization confirmed the presence of severe aortic stenosis with peak to peak and mean transvalvular gradients measured 54 and 37.5 mmHg, respectively, corresponding to aortic valve area calculated 0.77 cm.  Right heart pressures were normal and the patient did not have significant coronary artery disease.  Patient was referred for surgical consultation.   Patient is married and lives locally in [place] with his wife.  They have 2 young adult sons.  The patient works full-time as a Special educational needs teacher firm.  He has been otherwise healthy throughout his life and reports no physical limitations until the recent development of symptoms of exertional shortness of breath and chest tightness.  Symptoms of shortness of breath and chest discomfort only occur with more strenuous physical exertion and do not occur with ordinary low-level activity.  The patient has never had any resting shortness of breath or chest discomfort.  He denies any history  of PND, orthopnea, or lower extremity edema.  He has not had any palpitations, dizzy spells, nor syncope.   He was originally seen in consultation on July 07, 2020.  Since then he underwent cardiac gated CT angiogram of the heart and CT angiogram of the aorta and iliac vessels.  He reports no new problems or complaints.  He describes stable symptoms of exertional shortness of breath and chest tightness.   Because of the presence of relatively small aortic root and aortic annulus with possible need for root enlargement or root replacement I would not recommend performing valve replacement via minithoracotomy approach.  Discussion was held comparing the relative risks of mechanical valve replacement with need for lifelong anticoagulation versus use of a bioprosthetic tissue valve and the associated potential for late structural valve deterioration and failure.  This discussion was placed in the context of the patient's particular circumstances, and as a result the patient specifically requests that their valve be replaced using a bioprosthetic tissue valve. He presented to [facility] on 07/29/2020 in order to undergo an AVR.  Hospital Course:

## 2020-07-29 NOTE — Anesthesia Procedure Notes (Signed)
Central Venous Catheter Insertion Performed by: Roberts Gaudy, MD, anesthesiologist Start/End4/20/2022 7:55 AM, 07/29/2020 8:05 AM Patient location: Pre-op. Preanesthetic checklist: patient identified, IV checked, site marked, risks and benefits discussed, surgical consent, monitors and equipment checked, pre-op evaluation, timeout performed and anesthesia consent Position: supine Hand hygiene performed  and maximum sterile barriers used  PA cath was placed.Swan type:thermodilution Procedure performed using ultrasound guided technique. Attempts: 1 Following insertion, line sutured, dressing applied and Biopatch. Post procedure assessment: blood return through all ports  Patient tolerated the procedure well with no immediate complications. Additional procedure comments: inserted to 50 cm at insertion site.

## 2020-07-29 NOTE — Anesthesia Procedure Notes (Signed)
Procedure Name: Intubation Date/Time: 07/29/2020 8:53 AM Performed by: Inda Coke, CRNA Pre-anesthesia Checklist: Patient identified, Emergency Drugs available, Suction available and Patient being monitored Patient Re-evaluated:Patient Re-evaluated prior to induction Oxygen Delivery Method: Circle System Utilized Preoxygenation: Pre-oxygenation with 100% oxygen Induction Type: IV induction Ventilation: Mask ventilation without difficulty Laryngoscope Size: Mac and 4 Grade View: Grade I Tube type: Oral Tube size: 8.0 mm Number of attempts: 1 Airway Equipment and Method: Stylet and Oral airway Placement Confirmation: ETT inserted through vocal cords under direct vision,  positive ETCO2 and breath sounds checked- equal and bilateral Secured at: 21 cm Tube secured with: Tape Dental Injury: Teeth and Oropharynx as per pre-operative assessment

## 2020-07-29 NOTE — Op Note (Signed)
CARDIOTHORACIC SURGERY OPERATIVE NOTE  Date of Procedure:  07/29/2020  Preoperative Diagnosis: Severe Aortic Stenosis   Postoperative Diagnosis: Same   Procedure:    Aortic Valve Replacement  Edwards Inspiris Resilia stented bovine pericardial tissue valve (size 21 mm, ref # [id number], serial # [id number])  Bovine pericardial patch enlargement of the aortic root (Nick's procedure)   Surgeon: Valentina Gu. Roxy Manns, MD  Assistant: Nani Skillern, PA-C  Anesthesia: Roberts Gaudy, MD  Operative Findings:  Congenital unicuspid aortic valve (2 raphes)  Severe aortic stenosis  Small diameter aortic annulus, aortic root and proximal ascending aorta  Normal left ventricular systolic function  Moderate left ventricular hypertrophy                BRIEF CLINICAL NOTE AND INDICATIONS FOR SURGERY  [name] with history of GE reflux disease, gout, hypertension, hyperlipidemia,and known history of heart murmur who has been referred for surgical consultation for management of recently discovered severe symptomatic aortic stenosis.  Patient states that he was told he had a heart murmur many years ago. He never had it formally evaluated until recently. Over the past 2 to 3 months he has developed symptoms of exertional shortness of breath and chest tightness. He was seen by his primary care physician who noted a prominent systolic murmur on physical exam. He was referred for cardiology evaluation and was seen by Dr. Johney Frame on June 16, 2020. Echocardiogram performed June 19, 2020 revealed severe aortic stenosis with normal left ventricular systolic function. The patient was referred to the multidisciplinary heart valve clinic and underwent diagnostic cardiac catheterization by Dr. Angelena Form on July 01, 2020. Catheterization confirmed the presence of severe aortic stenosis with peak to peak and mean transvalvular gradients measured 54 and 37.5 mmHg,  respectively, corresponding to aortic valve area calculated 0.77 cm. Right heart pressures were normal and the patient did not have significant coronary artery disease. Patient was referred for surgical consultation.    DETAILS OF THE OPERATIVE PROCEDURE  Preparation:  The patient is brought to the operating room on the above mentioned date and central monitoring was established by the anesthesia team including placement of Swan-Ganz catheter and radial arterial line. The patient is placed in the supine position on the operating table.  Intravenous antibiotics are administered. General endotracheal anesthesia is induced uneventfully. A Foley catheter is placed.  Baseline transesophageal echocardiogram was performed.  Findings were notable for severe aortic stenosis with normal left ventricular function.  There was trace aortic insufficiency.  There was moderate left ventricular hypertrophy.  The patient's chest, abdomen, both groins, and both lower extremities are prepared and draped in a sterile manner. A time out procedure is performed.   Surgical Approach:  A median sternotomy incision was performed and the pericardium is opened. The ascending aorta is small in caliber but otherwise normal in appearance.    Extracorporeal Cardiopulmonary Bypass and Myocardial Protection:  The ascending aorta and the right atrium are cannulated for cardiopulmonary bypass.  Adequate heparinization is verified.   A retrograde cardioplegia cannula is placed through the right atrium into the coronary sinus.  The operative field was continuously flooded with carbon dioxide gas.  The entire pre-bypass portion of the operation was notable for stable hemodynamics.  Cardiopulmonary bypass was begun and the surface of the heart is inspected.  A left ventricular vent is placed through the right superior pulmonary vein.  A cardioplegia cannula is placed in the ascending aorta.  A temperature probe was placed in  the interventricular septum.  The patient is cooled to 32C systemic temperature.  The aortic cross clamp is applied and cardioplegia is delivered initially in an antegrade fashion through the aortic root using modified [place] cold blood cardioplegia ([place] blood cardioplegia protocol).   The initial cardioplegic arrest is rapid with early diastolic arrest.  Repeat doses of cardioplegia are administered at 90 minutes and every 30 minutes thereafter through the coronary sinus catheter in order to maintain completely flat electrocardiogram.  Myocardial protection was felt to be excellent.   Aortic Valve Replacement with Aortic Root Enlargement:  An oblique transverse aortotomy incision was performed.  Sinotubular junction was notably quite narrow so the incision was extended down the non-coronary sinus of Valsalva close to the aortic annulus.  The aortic valve was inspected and notable for unicuspid valve with 2 calcified raphes and a single opening between the left and noncoronary portions of the aortic valve.  There was severe aortic stenosis.  The aortic valve leaflets were excised sharply and the aortic annulus decalcified.  Decalcification was notably straightforward.  The aortic annulus was small but with further extension of the aortotomy down to the annulus a 21 mm sizer would fit as long as the noncoronary sinus of Valsalva could be patch enlarged to allow the valve to seat appropriately.  The aortic root and left ventricle were irrigated with copious cold saline solution.  Aortic valve replacement was performed using interrupted horizontal mattress 2-0 Ethibond pledgeted sutures with pledgets in the subannular position.  A patch of bovine pericardium (Peri-guard ref #[id number], Lot [id number]) was rinsed and trimmed to an appropriate elliptical shape to be utilized for patch augmentation of the aortic root.  An Edwards Inspiris Resilia stented bovine pericardial tissue valve (size 21  mm, ref # [id number], serial # [id number]) was implanted uneventfully. The valve sutures adjacent to the left and right leaflets of the valve were secured using a Cor-knot device.  The apex of the pericardial patch was secured through and through beneath the aortic annulus incorporating the sewing ring of the aortic valve at the nadir of the noncoronary sinus of Valsalva.  All of the valve sutures around this portion of the ring were hand tied.  The valve seated appropriately with adequate space beneath the left main and right coronary artery.  The pericardial patch was then utilized to close the aortotomy with running 2 layer closure of 4-0 Prolene suture beginning at the apex of the patch at the nadir of the left coronary sinus of Valsalva and extending circumferentially across the entire proximal anterior surface of the ascending aorta.   Procedure Completion:  One final dose of warm retrograde "reanimation dose" cardioplegia was administered retrograde through the coronary sinus catheter while all air was evacuated through the aortic root.  The aortic cross clamp was removed after a total cross clamp time of 134 minutes.  Epicardial pacing wires are fixed to the right ventricular outflow tract and to the right atrial appendage. The patient is rewarmed to 37C temperature. The aortic and left ventricular vents are removed.  The patient is weaned and disconnected from cardiopulmonary bypass.  The patient's rhythm at separation from bypass was AV paced.  The patient was weaned from cardiopulmonary bypass without any inotropic support. Total cardiopulmonary bypass time for the operation was 156 minutes.  Followup transesophageal echocardiogram performed after separation from bypass revealed a well-seated aortic valve prosthesis that was functioning normally and without any sign of paravalvular leak.  Left ventricular function was unchanged  from preoperatively.  The aortic and venous cannula were removed  uneventfully. Protamine was administered to reverse the anticoagulation. The mediastinum and pleural space were inspected for hemostasis and irrigated with saline solution. The mediastinum was drained using 2 chest tubes placed through separate stab incisions inferiorly.  The soft tissues anterior to the aorta were reapproximated loosely. The sternum is closed with Fibertape cerclage. The soft tissues anterior to the sternum were closed in multiple layers and the skin is closed with a running subcuticular skin closure.  The post-bypass portion of the operation was notable for stable rhythm and hemodynamics.  No blood products were administered during the operation.   Disposition:  The patient tolerated the procedure well and is transported to the surgical intensive care in stable condition. There are no intraoperative complications. All sponge instrument and needle counts are verified correct at completion of the operation.    Valentina Gu. Roxy Manns MD 07/29/2020 2:01 PM

## 2020-07-29 NOTE — Anesthesia Procedure Notes (Signed)
Arterial Line Insertion Start/End4/20/2022 8:00 AM, 07/29/2020 8:10 AM Performed by: Inda Coke, CRNA, CRNA  Preanesthetic checklist: patient identified, IV checked, site marked, risks and benefits discussed, surgical consent, monitors and equipment checked, pre-op evaluation, timeout performed and anesthesia consent Patient sedated Right, radial was placed Catheter size: 20 G Hand hygiene performed  and maximum sterile barriers used  Allen's test indicative of satisfactory collateral circulation Attempts: 2 Procedure performed without using ultrasound guided technique. Following insertion, dressing applied and Biopatch. Post procedure assessment: normal  Patient tolerated the procedure well with no immediate complications.

## 2020-07-29 NOTE — Progress Notes (Signed)
[facility] BRIEF SICU PROGRESS NOTE  Day of Surgery  S/P Procedure(s) (LRB): AORTIC VALVE REPLACEMENT (AVR) USING 21MM INSPIRIS RESILIA  AORTIC VALVE (N/A) TRANSESOPHAGEAL ECHOCARDIOGRAM (TEE) (N/A) AORTIC ROOT ENLARGEMENT USING 6CMX8CM PERI-GUARD PERICARDIAL PATCH   Extubated uneventfully NSR w/ stable hemodynamics, no drips Breathing comfortably w/ O2 sats 100% Chest tube output low UOP excellent Labs okay  Plan: Continue routine early postop  Rexene Alberts, MD 07/29/2020 6:25 PM

## 2020-07-29 NOTE — Discharge Instructions (Signed)
Discharge Instructions:  1. You may shower, please wash incisions daily with soap and water and keep dry.  If you wish to cover wounds with dressing you may do so but please keep clean and change daily.  No tub baths or swimming until incisions have completely healed.  If your incisions become red or develop any drainage please call our office at [phone]  2. No Driving until cleared by Dr. Guy Sandifer office and you are no longer using narcotic pain medications  3. Monitor your weight daily.. Please use the same scale and weigh at same time... If you gain 5-10 lbs in 48 hours with associated lower extremity swelling, please contact our office at [phone]  4. Fever of 101.5 for at least 24 hours with no source, please contact our office at [phone]  5. Activity- up as tolerated, please walk at least 3 times per day.  Avoid strenuous activity, no lifting, pushing, or pulling with your arms over 8-10 lbs for a minimum of 2 weeks  6. If any questions or concerns arise, please do not hesitate to contact our office at [phone]

## 2020-07-29 NOTE — Brief Op Note (Addendum)
07/29/2020  1:58 PM  PATIENT:  William Welch  55 y.o. male  PRE-OPERATIVE DIAGNOSIS:  Severe aortic stenosis   POST-OPERATIVE DIAGNOSIS:  Severe aortic stenosis   PROCEDURE: TRANSESOPHAGEAL ECHOCARDIOGRAM (TEE), AORTIC VALVE REPLACEMENT (AVR) (USING Model# 11500A, Serial # [id number], Size 21MM INSPIRIS RESILIA  AORTIC VALVE and AORTIC ANNULUS ENLARGEMENT (USING 6CMX8CM PERI-GUARD PERICARDIAL PATCH)  SURGEON:  Surgeon(s) and Role:    Rexene Alberts, MD - Primary  PHYSICIAN ASSISTANT: Lars Pinks PA-C  ASSISTANTS: Alcide Evener RNFA  ANESTHESIA:   general  EBL:  Per anesthesia/perfusion record  DRAINS: Chest tubes placed in the mediastinal and pleural spaces   SPECIMEN:  Source of Specimen:  Native AV leaflets  DISPOSITION OF SPECIMEN:  PATHOLOGY  COUNTS CORRECT:  YES  DICTATION: .Dragon Dictation  PLAN OF CARE: Admit to inpatient   PATIENT DISPOSITION:  ICU - intubated and hemodynamically stable.   Delay start of Pharmacological VTE agent (>24hrs) due to surgical blood loss or risk of bleeding: yes  BASELINE WEIGHT: 67.1 kg

## 2020-07-29 NOTE — Interval H&P Note (Signed)
History and Physical Interval Note:  07/29/2020 7:36 AM  William Welch  has presented today for surgery, with the diagnosis of AS.  The various methods of treatment have been discussed with the patient and family. After consideration of risks, benefits and other options for treatment, the patient has consented to  [name] as a surgical intervention.  The patient's history has been reviewed, patient examined, no change in status, stable for surgery.  I have reviewed the patient's chart and labs.  Questions were answered to the patient's satisfaction.     Rexene Alberts

## 2020-07-29 NOTE — Anesthesia Procedure Notes (Signed)
Central Venous Catheter Insertion Performed by: Roberts Gaudy, MD, anesthesiologist Start/End4/20/2022 7:55 AM, 07/29/2020 8:05 AM Patient location: Pre-op. Preanesthetic checklist: patient identified, IV checked, site marked, risks and benefits discussed, surgical consent, monitors and equipment checked, pre-op evaluation, timeout performed and anesthesia consent Lidocaine 1% used for infiltration and patient sedated Hand hygiene performed  and maximum sterile barriers used  Catheter size: 9 Fr Sheath introducer Procedure performed using ultrasound guided technique. Ultrasound Notes:anatomy identified, needle tip was noted to be adjacent to the nerve/plexus identified, no ultrasound evidence of intravascular and/or intraneural injection and image(s) printed for medical record Attempts: 1 Following insertion, line sutured and dressing applied. Post procedure assessment: blood return through all ports, free fluid flow and no air  Patient tolerated the procedure well with no immediate complications.

## 2020-07-30 ENCOUNTER — Encounter (HOSPITAL_COMMUNITY): Payer: Self-pay | Admitting: Thoracic Surgery (Cardiothoracic Vascular Surgery)

## 2020-07-30 ENCOUNTER — Inpatient Hospital Stay (HOSPITAL_COMMUNITY): Payer: Commercial Managed Care - PPO

## 2020-07-30 ENCOUNTER — Ambulatory Visit: Payer: Commercial Managed Care - PPO | Admitting: Cardiology

## 2020-07-30 DIAGNOSIS — D62 Acute posthemorrhagic anemia: Secondary | ICD-10-CM | POA: Diagnosis not present

## 2020-07-30 DIAGNOSIS — Q230 Congenital stenosis of aortic valve: Secondary | ICD-10-CM | POA: Diagnosis not present

## 2020-07-30 DIAGNOSIS — Z006 Encounter for examination for normal comparison and control in clinical research program: Secondary | ICD-10-CM | POA: Diagnosis not present

## 2020-07-30 DIAGNOSIS — I350 Nonrheumatic aortic (valve) stenosis: Secondary | ICD-10-CM | POA: Diagnosis not present

## 2020-07-30 LAB — GLUCOSE, CAPILLARY
Glucose-Capillary: 125 mg/dL — ABNORMAL HIGH (ref 70–99)
Glucose-Capillary: 125 mg/dL — ABNORMAL HIGH (ref 70–99)
Glucose-Capillary: 134 mg/dL — ABNORMAL HIGH (ref 70–99)
Glucose-Capillary: 136 mg/dL — ABNORMAL HIGH (ref 70–99)
Glucose-Capillary: 147 mg/dL — ABNORMAL HIGH (ref 70–99)
Glucose-Capillary: 148 mg/dL — ABNORMAL HIGH (ref 70–99)
Glucose-Capillary: 148 mg/dL — ABNORMAL HIGH (ref 70–99)
Glucose-Capillary: 148 mg/dL — ABNORMAL HIGH (ref 70–99)
Glucose-Capillary: 148 mg/dL — ABNORMAL HIGH (ref 70–99)
Glucose-Capillary: 151 mg/dL — ABNORMAL HIGH (ref 70–99)
Glucose-Capillary: 155 mg/dL — ABNORMAL HIGH (ref 70–99)
Glucose-Capillary: 160 mg/dL — ABNORMAL HIGH (ref 70–99)
Glucose-Capillary: 162 mg/dL — ABNORMAL HIGH (ref 70–99)
Glucose-Capillary: 180 mg/dL — ABNORMAL HIGH (ref 70–99)
Glucose-Capillary: 189 mg/dL — ABNORMAL HIGH (ref 70–99)
Glucose-Capillary: 76 mg/dL (ref 70–99)

## 2020-07-30 LAB — CBC
HCT: 26.9 % — ABNORMAL LOW (ref 39.0–52.0)
HCT: 27.5 % — ABNORMAL LOW (ref 39.0–52.0)
Hemoglobin: 9.4 g/dL — ABNORMAL LOW (ref 13.0–17.0)
Hemoglobin: 9.2 g/dL — ABNORMAL LOW (ref 13.0–17.0)
MCH: 33.2 pg (ref 26.0–34.0)
MCH: 32.2 pg (ref 26.0–34.0)
MCHC: 34.9 g/dL (ref 30.0–36.0)
MCHC: 33.5 g/dL (ref 30.0–36.0)
MCV: 95.1 fL (ref 80.0–100.0)
MCV: 96.2 fL (ref 80.0–100.0)
Platelets: 83 10*3/uL — ABNORMAL LOW (ref 150–400)
Platelets: 84 10*3/uL — ABNORMAL LOW (ref 150–400)
RBC: 2.83 MIL/uL — ABNORMAL LOW (ref 4.22–5.81)
RBC: 2.86 MIL/uL — ABNORMAL LOW (ref 4.22–5.81)
RDW: 13.1 % (ref 11.5–15.5)
RDW: 13.4 % (ref 11.5–15.5)
WBC: 9.2 10*3/uL (ref 4.0–10.5)
WBC: 12.4 10*3/uL — ABNORMAL HIGH (ref 4.0–10.5)
nRBC: 0 % (ref 0.0–0.2)
nRBC: 0 % (ref 0.0–0.2)

## 2020-07-30 LAB — BASIC METABOLIC PANEL
Anion gap: 6 (ref 5–15)
Anion gap: 6 (ref 5–15)
BUN: 11 mg/dL (ref 6–20)
BUN: 16 mg/dL (ref 6–20)
CO2: 21 mmol/L — ABNORMAL LOW (ref 22–32)
CO2: 24 mmol/L (ref 22–32)
Calcium: 8 mg/dL — ABNORMAL LOW (ref 8.9–10.3)
Calcium: 8.5 mg/dL — ABNORMAL LOW (ref 8.9–10.3)
Chloride: 110 mmol/L (ref 98–111)
Chloride: 105 mmol/L (ref 98–111)
Creatinine, Ser: 1.22 mg/dL (ref 0.61–1.24)
Creatinine, Ser: 1.2 mg/dL (ref 0.61–1.24)
GFR, Estimated: >60 mL/min (ref >60)
GFR, Estimated: >60 mL/min (ref >60)
Glucose, Bld: 137 mg/dL — ABNORMAL HIGH (ref 70–99)
Glucose, Bld: 137 mg/dL — ABNORMAL HIGH (ref 70–99)
Potassium: 4.4 mmol/L (ref 3.5–5.1)
Potassium: 4 mmol/L (ref 3.5–5.1)
Sodium: 137 mmol/L (ref 135–145)
Sodium: 135 mmol/L (ref 135–145)

## 2020-07-30 LAB — MAGNESIUM
Magnesium: 2.3 mg/dL (ref 1.7–2.4)
Magnesium: 2.4 mg/dL (ref 1.7–2.4)

## 2020-07-30 MED ORDER — ENOXAPARIN SODIUM 30 MG/0.3ML ~~LOC~~ SOLN
30.0000 mg | Freq: Every day | SUBCUTANEOUS | Status: DC
  Administered 2020-07-31: 30 mg via SUBCUTANEOUS
  Filled 2020-07-30: qty 0.3

## 2020-07-30 MED ORDER — ROSUVASTATIN CALCIUM 20 MG PO TABS
20.0000 mg | ORAL_TABLET | Freq: Every day | ORAL | Status: DC
  Administered 2020-08-02: 20 mg via ORAL
  Filled 2020-07-30: qty 1

## 2020-07-30 MED ORDER — EZETIMIBE 10 MG PO TABS
10.0000 mg | ORAL_TABLET | Freq: Every day | ORAL | Status: DC
  Administered 2020-08-02: 10 mg via ORAL
  Filled 2020-07-30: qty 1

## 2020-07-30 MED ORDER — KETOROLAC TROMETHAMINE 15 MG/ML IJ SOLN
15.0000 mg | Freq: Four times a day (QID) | INTRAMUSCULAR | Status: AC
  Administered 2020-07-30 – 2020-07-31 (×4): 15 mg via INTRAVENOUS
  Filled 2020-07-30 (×5): qty 1

## 2020-07-30 MED ORDER — HYDRALAZINE HCL 20 MG/ML IJ SOLN
5.0000 mg | Freq: Once | INTRAMUSCULAR | Status: AC
  Administered 2020-07-30: 5 mg via INTRAVENOUS
  Filled 2020-07-30: qty 1

## 2020-07-30 MED ORDER — FUROSEMIDE 10 MG/ML IJ SOLN
20.0000 mg | Freq: Two times a day (BID) | INTRAMUSCULAR | Status: DC
  Administered 2020-07-30: 20 mg via INTRAVENOUS
  Filled 2020-07-30: qty 2

## 2020-07-30 MED ORDER — INSULIN ASPART 100 UNIT/ML ~~LOC~~ SOLN
0.0000 [IU] | SUBCUTANEOUS | Status: DC
  Administered 2020-07-30 (×4): 2 [IU] via SUBCUTANEOUS

## 2020-07-30 MED ORDER — SODIUM CHLORIDE 0.9% FLUSH: 10.0000 mL | INTRAVENOUS | Status: DC | PRN

## 2020-07-30 MED ORDER — SODIUM CHLORIDE 0.9% FLUSH
10.0000 mL | Freq: Two times a day (BID) | INTRAVENOUS | Status: DC
  Administered 2020-07-30: 10 mL
  Administered 2020-07-30: 20 mL
  Administered 2020-07-30 – 2020-08-01 (×2): 10 mL

## 2020-07-30 MED FILL — Magnesium Sulfate Inj 50%: INTRAMUSCULAR | Qty: 10 | Status: AC

## 2020-07-30 MED FILL — Potassium Chloride Inj 2 mEq/ML: INTRAVENOUS | Qty: 40 | Status: AC

## 2020-07-30 MED FILL — Heparin Sodium (Porcine) Inj 1000 Unit/ML: INTRAMUSCULAR | Qty: 30 | Status: AC

## 2020-07-30 NOTE — Progress Notes (Signed)
[facility] Evening Rounds  POD #1 s/p AVR with root enlargement C/o pain with coughing; on toradol BP 118/75   Pulse 88   Temp 98.2 F (36.8 C) (Oral)   Resp (!) 21   Ht 5\' 6"  (1.676 m)   Wt 70.7 kg   SpO2 92%   BMI 25.16 kg/m  Alert/oriented CTA RRR Ext: mild edema   Intake/Output Summary (Last 24 hours) at 07/30/2020 1716 Last data filed at 07/30/2020 1500 Gross per 24 hour  Intake 1639.64 ml  Output 1066 ml  Net 573.64 ml    A/p:  Continue pain control Continue current management. Raylynn Hersh Z. Orvan Seen, [place]

## 2020-07-30 NOTE — Anesthesia Postprocedure Evaluation (Signed)
Anesthesia Post Note  Patient: William Welch  Procedure(s) Performed: AORTIC VALVE REPLACEMENT (AVR) USING 21MM INSPIRIS RESILIA  AORTIC VALVE (N/A Chest) TRANSESOPHAGEAL ECHOCARDIOGRAM (TEE) (N/A ) AORTIC ROOT ENLARGEMENT USING 6CMX8CM PERI-GUARD PERICARDIAL PATCH     Patient location during evaluation: SICU Anesthesia Type: General Level of consciousness: awake, awake and alert and oriented Pain management: pain level controlled Vital Signs Assessment: post-procedure vital signs reviewed and stable Respiratory status: spontaneous breathing, nonlabored ventilation and patient connected to nasal cannula oxygen Cardiovascular status: blood pressure returned to baseline Anesthetic complications: no   No complications documented.  Last Vitals:  Vitals:   07/30/20 0100 07/30/20 0122  BP:  91/66  Pulse: 94 92  Resp: 19 19  Temp: 37.8 C 37.8 C  SpO2: 98% 95%    Last Pain:  Vitals:   07/30/20 0000  TempSrc: Core  PainSc: 0-No pain                 Meekah Math COKER

## 2020-07-30 NOTE — Addendum Note (Signed)
Addendum  created 07/30/20 0821 by Josephine Igo, CRNA   Order list changed, Pharmacy for encounter modified

## 2020-07-30 NOTE — Addendum Note (Signed)
Addendum  created 07/30/20 1322 by Roberts Gaudy, MD   Clinical Note Signed

## 2020-07-30 NOTE — Progress Notes (Addendum)
[facility] DAILY ICU PROGRESS NOTE                   [address]            [place] [address]          [phone]   1 Day Post-Op Procedure(s) (LRB): AORTIC VALVE REPLACEMENT (AVR) USING 21MM INSPIRIS RESILIA  AORTIC VALVE (N/A) TRANSESOPHAGEAL ECHOCARDIOGRAM (TEE) (N/A) AORTIC ROOT ENLARGEMENT USING 6CMX8CM PERI-GUARD PERICARDIAL PATCH  Total Length of Stay:  LOS: 1 day   Subjective:  Awake and alert, no concerns. Pain well controlled  Extubated at ~4:30pm, on RA past several hours.   Objective: Vital signs in last 24 hours: Temp:  [98.6 F (37 C)-100.8 F (38.2 C)] 100.22 F (37.9 C) (04/21 0700) Pulse Rate:  [90-100] 92 (04/21 0700) Cardiac Rhythm: Normal sinus rhythm (04/21 0400) Resp:  [12-22] 17 (04/21 0700) BP: (80-119)/(55-95) 88/61 (04/21 0700) SpO2:  [93 %-100 %] 96 % (04/21 0700) Arterial Line BP: (94-282)/(46-277) 116/57 (04/21 0700) FiO2 (%):  [40 %-50 %] 40 % (04/20 1614) Weight:  [70.7 kg] 70.7 kg (04/21 0615)  Filed Weights   07/29/20 0712 07/30/20 0615  Weight: 67.1 kg 70.7 kg    Weight change:    Hemodynamic parameters for last 24 hours: PAP: (21-42)/(11-22) 36/20 CO:  [2.8 L/min-3.9 L/min] 3.9 L/min CI:  [1.7 L/min/m2-2.3 L/min/m2] 2.3 L/min/m2  Intake/Output from previous day: 04/20 0701 - 04/21 0700 In: 4256.6 [P.O.:50; I.V.:3026.7; Blood:300; IV Piggyback:879.9] Out: 9675 [Urine:2910; Blood:425; Chest Tube:261]  Intake/Output this shift: No intake/output data recorded.  Current Meds: Scheduled Meds: . acetaminophen  1,000 mg Oral Q6H  . allopurinol  300 mg Oral Daily  . aspirin EC  325 mg Oral Daily  . bisacodyl  10 mg Oral Daily   Or  . bisacodyl  10 mg Rectal Daily  . Chlorhexidine Gluconate Cloth  6 each Topical Daily  . docusate sodium  200 mg Oral Daily  . [START ON 07/31/2020] enoxaparin (LOVENOX) injection  30 mg Subcutaneous QHS  . [START ON 08/02/2020] ezetimibe  10 mg Oral Daily  . insulin aspart  0-24  Units Subcutaneous Q4H  . [START ON 07/31/2020] pantoprazole  40 mg Oral Daily  . [START ON 08/02/2020] rosuvastatin  20 mg Oral Daily  . sodium chloride flush  10-40 mL Intracatheter Q12H  . sodium chloride flush  3 mL Intravenous Q12H   Continuous Infusions: . sodium chloride    . cefUROXime (ZINACEF)  IV Stopped (07/29/20 2117)  . lactated ringers    . lactated ringers    . phenylephrine (NEO-SYNEPHRINE) Adult infusion Stopped (07/30/20 0540)   PRN Meds:.metoprolol tartrate, morphine injection, ondansetron (ZOFRAN) IV, oxyCODONE, sodium chloride flush, sodium chloride flush, traMADol  General appearance: alert, cooperative and no distress Neurologic: intact Heart: NSR, soft systolic murmur c/w bioprosthetic valve.  Lungs: clear to auscultation bilaterally Abdomen: firm, non-tender.  Extremities: all warm and well perfused Wound: the sternotomy is covered with an  Aquacel dressing with minor spotting.   Lab Results: CBC: Recent Labs    07/29/20 2053 07/30/20 0415  WBC 10.4 9.2  HGB 10.9* 9.4*  HCT 32.5* 26.9*  PLT 102* 83*   BMET:  Recent Labs    07/29/20 2053 07/30/20 0415  NA 139 137  K 4.3 4.4  CL 112* 110  CO2 22 21*  GLUCOSE 123* 137*  BUN 8 11  CREATININE 1.04 1.22  CALCIUM 7.9* 8.0*    CMET: Lab Results  Component Value Date   WBC 9.2 07/30/2020   HGB 9.4 (L) 07/30/2020   HCT 26.9 (L) 07/30/2020   PLT 83 (L) 07/30/2020   GLUCOSE 137 (H) 07/30/2020   ALT 33 07/29/2020   AST 75 (H) 07/29/2020   NA 137 07/30/2020   K 4.4 07/30/2020   CL 110 07/30/2020   CREATININE 1.22 07/30/2020   BUN 11 07/30/2020   CO2 21 (L) 07/30/2020   INR 1.3 (H) 07/29/2020   HGBA1C 5.7 (H) 07/27/2020      PT/INR:  Recent Labs    07/29/20 1439  LABPROT 16.3*  INR 1.3*   Radiology: [facility] Chest Port 1 View  Result Date: 07/29/2020 CLINICAL DATA:  Patient status post aortic valve replacement today. EXAM: PORTABLE CHEST 1 VIEW COMPARISON:  PA and lateral chest  07/27/2020. FINDINGS: Endotracheal tube tip is in good position just above the clavicular heads. Left IJ approach Swan-Ganz catheter tip is in the distal pulmonary outflow tract. NG tube courses into the stomach and below the inferior margin the film. Mediastinal drain noted. Heart size is normal. Prosthetic aortic valve is now in place. Lungs are clear. No pneumothorax. IMPRESSION: Support tubes and lines as described. Lungs clear. Electronically Signed   By: Inge Rise M.D.   On: 07/29/2020 15:13   ECHO INTRAOPERATIVE TEE  Result Date: 07/29/2020  *INTRAOPERATIVE TRANSESOPHAGEAL REPORT *  Patient Name:   William Welch Date of Exam: 07/29/2020 Medical Rec #:  [id number]      Height:       66.0 in Accession #:    [id number]     Weight:       148.0 lb Date of Birth:  February 20, 1966       BSA:          1.76 m Patient Age:    55 years       BP:           150/95 mmHg Patient Gender: M              HR:           85 bpm. Exam Location:  Anesthesiology Transesophogeal exam was perform intraoperatively during surgical procedure. Patient was closely monitored under general anesthesia during the entirety of examination. Indications:     Aortic stenosis Sonographer:     Dustin Flock RDCS Performing Phys: [place] Diagnosing Phys: Roberts Gaudy MD Complications: No known complications during this procedure. PRE-OP FINDINGS  Left Ventricle: The left ventricle has normal systolic function, with an ejection fraction of 55-60%. The cavity size was normal. There was moderate concentric LVH with a wall thickness of 1.20-1.25 cm. There was normal systolic LV function with the EF estimated at 55-60% by the 2DQ border recogniton software. There were no regional wall motion abnormalities. On the post-bypass exam, the LV systolic function was unchanged from the pre-bypass study with the caculated ejection fraction of 55-60%. Right Ventricle: The right ventricle has normal systolic function. The cavity was normal. There  is no increase in right ventricular wall thickness. Left Atrium: Left atrial size was normal in size. The left atrial appendage is well visualized and there is evidence of thrombus present. Right Atrium: Right atrial size was normal in size. Interatrial Septum: No atrial level shunt detected by color flow Doppler. Pericardium: There is no evidence of pericardial effusion. Mitral Valve: The mitral valve is normal in structure. Mitral valve regurgitation is mild by color flow Doppler. There is No evidence of mitral  stenosis. Tricuspid Valve: The tricuspid valve was normal in structure. Tricuspid valve regurgitation is trivial by color flow Doppler. No evidence of tricuspid stenosis is present. Aortic Valve: There was severe aortic stenosis with an apparent bicuspid morphology. The leaflets were thickened with obvious restriction to opening. The peak velocity was 5,1 m/sec with a peak gradient of 104 mm hg and a mean gradient of 51 mm hg. The AVA by Vmax was  0.58 cm2 and by the VTI method was 0.57 cm2. The dimensionless index was 0.14. There was mild aortic insufficiency. The aortic annulus measured 2.1 cm On the post-bypass exam, there was a bioprosthetic valve in the aortic position. The leaflets opened normally without restriction and there was no aortic insufficiency. The peak velocity was 2.5 m/sec. with a mean gradient of 13 mm hg. The post-bypass dimensionless index was 0.39. Pulmonic Valve: The pulmonic valve was normal in structure. No evidence of pulmonic stenosis. Pulmonic valve regurgitation is trivial by color flow Doppler. Aorta: The aortic root and proximal ascending aorta had a normal contour without dilatation or effacement. There was mild intimal thickening but no protruding atheromatous disease. Aortic root at sinuses of valsalva: 2.62 cm Sino-tubular junction: 1.9 cm Proximal ascending aorta: 1.9 cm The desending thoracic aorta appeared normal and measured 1.88 cm. Shunts: There is no evidence of  an atrial septal defect. +--------------+--------++ LEFT VENTRICLE         +--------------+--------++ PLAX 2D                +--------------+--------++ LVOT diam:    2.20 cm  +--------------+--------++ LVOT Area:    3.80 cm +--------------+--------++                        +--------------+--------++ +------------------+------------++ AORTIC VALVE                   +------------------+------------++ AV Area (Vmax):   1.04 cm     +------------------+------------++ AV Area (Vmean):  1.21 cm     +------------------+------------++ AV Area (VTI):    1.09 cm     +------------------+------------++ AV Vmax:          315.25 cm/s  +------------------+------------++ AV Vmean:         196.750 cm/s +------------------+------------++ AV VTI:           0.598 m      +------------------+------------++ AV Peak Grad:     39.8 mmHg    +------------------+------------++ AV Mean Grad:     21.5 mmHg    +------------------+------------++ LVOT Vmax:        86.15 cm/s   +------------------+------------++ LVOT Vmean:       62.750 cm/s  +------------------+------------++ LVOT VTI:         0.172 m      +------------------+------------++ LVOT/AV VTI ratio:0.29         +------------------+------------++  +--------------+-------+ SHUNTS                +--------------+-------+ Systemic VTI: 0.17 m  +--------------+-------+ Systemic Diam:2.20 cm +--------------+-------+  Roberts Gaudy MD Electronically signed by Roberts Gaudy MD Signature Date/Time: 07/29/2020/7:43:03 PM    Final      Assessment/Plan: S/P Procedure(s) (LRB): AORTIC VALVE REPLACEMENT (AVR) USING 21MM INSPIRIS RESILIA  AORTIC VALVE (N/A) TRANSESOPHAGEAL ECHOCARDIOGRAM (TEE) (N/A) AORTIC ROOT ENLARGEMENT USING 6CMX8CM PERI-GUARD PERICARDIAL PATCH  -POD1 aortic root enlargement and bioprosthetic aortic valve replacement for 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 Stable cardiac  rhythm and hemodynamics. Will d/c PA  catheter and arterial line. Mobilize. Leave CT's for now and re-assess later today.   -Expected acute blood loss anemia- mild and well tolerated. Monitor.    -Endo- no h/o DM, glucose 76-150,  Monitor and continue SSI.   -Volume excess- mild, Wt. 3.5kg positive. Hold off on diuresis for now.   -DVT PPX- ambulate, start SQ enoxaparin this PM.     Antony Odea, PA-C [phone] 07/30/2020 7:40 AM   I have seen and examined the patient and agree with the assessment and plan as outlined.  Doing very well POD1  Rexene Alberts, MD 07/30/2020 8:08 AM

## 2020-07-30 NOTE — Progress Notes (Signed)
Anesthesiology Follow-up:  Awake and alert, neuro intact, taking PO liquids, pain controlled with acetaminophen.  VS: T-  36.7 BP- 106/71 Hr- 83 (SR)  RR-21 O2 Sat- 97% on RA, hemodynamically stable without pressors  K-4.4 Na-137 BUN/Cr.- 11/1.22 glucose- 117 H/H- 9.4/26.9 Platelets 83,000 CXR- mild bibasilar atelectasis lungs otherwise clear.  55 year old male one day S/P AVR and aortic  root enlargement for severe aortic stenosis,  stable post-op course, extubated 2 hours post-op. No apparent complications.  Roberts Gaudy

## 2020-07-31 ENCOUNTER — Inpatient Hospital Stay (HOSPITAL_COMMUNITY): Payer: Commercial Managed Care - PPO

## 2020-07-31 ENCOUNTER — Other Ambulatory Visit: Payer: Self-pay | Admitting: Physician Assistant

## 2020-07-31 DIAGNOSIS — I350 Nonrheumatic aortic (valve) stenosis: Secondary | ICD-10-CM

## 2020-07-31 LAB — GLUCOSE, CAPILLARY
Glucose-Capillary: 113 mg/dL — ABNORMAL HIGH (ref 70–99)
Glucose-Capillary: 161 mg/dL — ABNORMAL HIGH (ref 70–99)
Glucose-Capillary: 119 mg/dL — ABNORMAL HIGH (ref 70–99)
Glucose-Capillary: 136 mg/dL — ABNORMAL HIGH (ref 70–99)

## 2020-07-31 LAB — CBC
HCT: 26.4 % — ABNORMAL LOW (ref 39.0–52.0)
Hemoglobin: 8.9 g/dL — ABNORMAL LOW (ref 13.0–17.0)
MCH: 32.7 pg (ref 26.0–34.0)
MCHC: 33.7 g/dL (ref 30.0–36.0)
MCV: 97.1 fL (ref 80.0–100.0)
Platelets: 90 10*3/uL — ABNORMAL LOW (ref 150–400)
RBC: 2.72 MIL/uL — ABNORMAL LOW (ref 4.22–5.81)
RDW: 13.2 % (ref 11.5–15.5)
WBC: 11.6 10*3/uL — ABNORMAL HIGH (ref 4.0–10.5)
nRBC: 0 % (ref 0.0–0.2)

## 2020-07-31 LAB — BASIC METABOLIC PANEL
Anion gap: 7 (ref 5–15)
BUN: 15 mg/dL (ref 6–20)
CO2: 26 mmol/L (ref 22–32)
Calcium: 8.6 mg/dL — ABNORMAL LOW (ref 8.9–10.3)
Chloride: 103 mmol/L (ref 98–111)
Creatinine, Ser: 1.08 mg/dL (ref 0.61–1.24)
GFR, Estimated: >60 mL/min (ref >60)
Glucose, Bld: 110 mg/dL — ABNORMAL HIGH (ref 70–99)
Potassium: 3.9 mmol/L (ref 3.5–5.1)
Sodium: 136 mmol/L (ref 135–145)

## 2020-07-31 LAB — SURGICAL PATHOLOGY

## 2020-07-31 MED ORDER — METOPROLOL TARTRATE 25 MG PO TABS
25.0000 mg | ORAL_TABLET | Freq: Two times a day (BID) | ORAL | Status: DC
  Administered 2020-07-31 – 2020-08-02 (×5): 25 mg via ORAL
  Filled 2020-07-31 (×6): qty 1

## 2020-07-31 MED ORDER — POTASSIUM CHLORIDE CRYS ER 20 MEQ PO TBCR
40.0000 meq | EXTENDED_RELEASE_TABLET | Freq: Two times a day (BID) | ORAL | Status: AC
  Administered 2020-07-31 (×2): 40 meq via ORAL
  Filled 2020-07-31 (×2): qty 2

## 2020-07-31 MED ORDER — ~~LOC~~ CARDIAC SURGERY, PATIENT & FAMILY EDUCATION: Freq: Once | Status: DC

## 2020-07-31 MED ORDER — FUROSEMIDE 10 MG/ML IJ SOLN
40.0000 mg | Freq: Two times a day (BID) | INTRAMUSCULAR | Status: AC
  Administered 2020-07-31 (×2): 40 mg via INTRAVENOUS
  Filled 2020-07-31 (×2): qty 4

## 2020-07-31 MED ORDER — FE FUMARATE-B12-VIT C-FA-IFC PO CAPS
1.0000 | ORAL_CAPSULE | Freq: Every day | ORAL | Status: DC
  Administered 2020-07-31 – 2020-08-02 (×3): 1 via ORAL
  Filled 2020-07-31 (×3): qty 1

## 2020-07-31 MED ORDER — IRBESARTAN 150 MG PO TABS
75.0000 mg | ORAL_TABLET | Freq: Every day | ORAL | Status: DC
  Administered 2020-07-31 – 2020-08-01 (×2): 75 mg via ORAL
  Filled 2020-07-31 (×3): qty 1

## 2020-07-31 MED FILL — Sodium Chloride IV Soln 0.9%: INTRAVENOUS | Qty: 2000 | Status: AC

## 2020-07-31 MED FILL — Calcium Chloride Inj 10%: INTRAVENOUS | Qty: 10 | Status: AC

## 2020-07-31 MED FILL — Lidocaine HCl Local Preservative Free (PF) Inj 2%: INTRAMUSCULAR | Qty: 15 | Status: AC

## 2020-07-31 MED FILL — Albumin, Human Inj 5%: INTRAVENOUS | Qty: 250 | Status: AC

## 2020-07-31 MED FILL — Heparin Sodium (Porcine) Inj 1000 Unit/ML: INTRAMUSCULAR | Qty: 20 | Status: AC

## 2020-07-31 MED FILL — Lidocaine HCl Local Soln Prefilled Syringe 100 MG/5ML (2%): INTRAMUSCULAR | Qty: 15 | Status: AC

## 2020-07-31 MED FILL — Electrolyte-R (PH 7.4) Solution: INTRAVENOUS | Qty: 3000 | Status: AC

## 2020-07-31 MED FILL — Mannitol IV Soln 20%: INTRAVENOUS | Qty: 1000 | Status: AC

## 2020-07-31 NOTE — Progress Notes (Signed)
Pt transferred to rm [id number].  VSS, tele initiated.  No complaints at this time, orders released.

## 2020-07-31 NOTE — Discharge Summary (Signed)
Physician Discharge Summary       [address]       [place] [id number]             [phone]    Patient ID: William AristaRandy D Sem MRN: [id number] DOB/AGE: 54/07/1965 54 y.o.  Admit date: 07/29/2020 Discharge date: 08/02/2020  Admission Diagnoses: Severe aortic stenosis Discharge Diagnoses:  1. S/P aortic valve replacement with bioprosthetic valve 2. Expected post op blood loss anemia 3. History of Hypertension 4. History of Hyperlipidemia 5. History of Gout 6. History of GERD (gastroesophageal reflux disease) 7. History of arthritis  Consults: None  Procedure (s):   Aortic Valve Replacement             Edwards Inspiris Resilia stented bovine pericardial tissue valve (size 21 mm, ref # [id number], serial # [id number])             Bovine pericardial patch enlargement of the aortic root (Nick's procedure) by Dr. Cornelius Moraswen on 07/29/2020.  History of Presenting Illness: This is a 55 year old male with a past medical history of  GE reflux disease, gout, hypertension, hyperlipidemia, and known history of heart murmur who has been referred for surgical consultation for management of recently discovered severe symptomatic aortic stenosis. Patient states that he was told he had a heart murmur many years ago.  He never had it formally evaluated until recently.  Over the past 2 to 3 months he has developed symptoms of exertional shortness of breath and chest tightness.  He was seen by his primary care physician who noted a prominent systolic murmur on physical exam.  He was referred for cardiology evaluation and was seen by Dr. Shari ProwsPemberton on June 16, 2020.  Echocardiogram performed June 19, 2020 revealed severe aortic stenosis with normal left ventricular systolic function.  The patient was referred to the multidisciplinary heart valve clinic and underwent diagnostic cardiac catheterization by Dr. Clifton JamesMcAlhany on July 01, 2020.  Catheterization confirmed the presence of severe aortic stenosis  with peak to peak and mean transvalvular gradients measured 54 and 37.5 mmHg, respectively, corresponding to aortic valve area calculated 0.77 cm.  Right heart pressures were normal and the patient did not have significant coronary artery disease.  Patient was referred for surgical consultation.   Patient is married and [place].  They have 2 young adult sons.  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  He has been otherwise healthy throughout his life and reports no physical limitations until the recent development of symptoms of exertional shortness of breath and chest tightness.  Symptoms of shortness of breath and chest discomfort only occur with more strenuous physical exertion and do not occur with ordinary low-level activity.  The patient has never had any resting shortness of breath or chest discomfort.  He denies any history of PND, orthopnea, or lower extremity edema.  He has not had any palpitations, dizzy spells, nor syncope.   He was originally seen in consultation on July 07, 2020.  Since then he underwent cardiac gated CT angiogram of the heart and CT angiogram of the aorta and iliac vessels.  He reports no new problems or complaints.  He describes stable symptoms of exertional shortness of breath and chest tightness.   Because of the presence of relatively small aortic root and aortic annulus with possible need for root enlargement or root replacement I would not recommend performing valve replacement via minithoracotomy approach.  Discussion was held  comparing the relative risks of mechanical valve replacement with need for lifelong anticoagulation versus use of a bioprosthetic tissue valve and the associated potential for late structural valve deterioration and failure.  This discussion was placed in the context of the patient's particular circumstances, and as a result the patient specifically requests that their valve be replaced  using a bioprosthetic tissue valve. He [facility] on 07/29/2020 in order to undergo an AVR.  Brief Hospital Course:  Patient was extubated successfully early afternoon of surgery. He remained afebrile and hemodynamically stable. He was weaned off Neo Synephrine drip. Gordy Councilman, a line, chest tubes and foley were all removed early in his post operative course. He was started on Lopressor on 04/22. He was volume overloaded and diuresed accordingly. He had expected post op blood loss anemia. He did not require a transfusion. His H and H on post operative day 2 was slightly decreased to 8.9 and 26.4. He was started on Trinsicon. He had thrombocytopenia post op. His platelet count went as low as 83,000 but slowly increased and normalized. He was weaned off Insulin drip. His pre op HGA1C was 5.7. He likely has pre diabetes and will need follow up and further surveillance with his medical doctor after discharge. He was restarted on Irbesartan to help with BP control. He was felt surgically stable for transfer from the ICU to 4E for further convalescence on 04/22. Pacer wires were remove routinely.  He continued to progress and quickly regained independence with his mobility. We was weaned from the supplemental O2 without difficulty and had return of appropriate bowel and bladder function.  He had no significant arrhythmias. He was ready for discharge on post-op day 4.    Latest Vital Signs: Blood pressure 103/78, pulse 90, temperature 97.9 F (36.6 C), temperature source Oral, resp. rate 20, height 5\' 6"  (1.676 m), weight 65 kg, SpO2 95 %.  Physical Exam:  General appearance: alert, cooperative and no distress Neurologic: intact Heart: NSR, soft systolic murmur c/w bioprosthetic valve.  Lungs: clear to auscultation bilaterally.  Abdomen: soft,NT Extremities: all warm and well perfused, no edema. Wound: the sternotomy incision is well approximated and dry.   Discharge Condition: Stable and  discharged to home.  Recent laboratory studies:  Lab Results  Component Value Date   WBC 9.0 08/02/2020   HGB 9.0 (L) 08/02/2020   HCT 27.1 (L) 08/02/2020   MCV 96.8 08/02/2020   PLT 124 (L) 08/02/2020   Lab Results  Component Value Date   NA 138 08/02/2020   K 3.9 08/02/2020   CL 102 08/02/2020   CO2 27 08/02/2020   CREATININE 1.12 08/02/2020   GLUCOSE 116 (H) 08/02/2020      Diagnostic Studies: DG Chest 2 View  Result Date: 08/01/2020 CLINICAL DATA:  Status post aortic valve replacement with bioprosthetic valve. EXAM: CHEST - 2 VIEW COMPARISON:  07/31/20 FINDINGS: Stable cardiomediastinal contours. Small bilateral pleural effusions and bibasilar atelectasis, unchanged. No airspace consolidation. IMPRESSION: No change in small bilateral pleural effusions with bibasilar atelectasis. Electronically Signed   By: Kerby Moors M.D.   On: 08/01/2020 08:06   DG Chest 2 View  Result Date: 07/28/2020 CLINICAL DATA:  55 year old male with history of scratch the 55 year old male under preoperative evaluation prior to aortic valve replacement. EXAM: CHEST - 2 VIEW COMPARISON:  No priors. FINDINGS: Lung volumes are normal. No consolidative airspace disease. No pleural effusions. No pneumothorax. No pulmonary nodule or mass noted. Pulmonary vasculature and the cardiomediastinal  silhouette are within normal limits. IMPRESSION: No radiographic evidence of acute cardiopulmonary disease. Electronically Signed   By: Vinnie Langton M.D.   On: 07/28/2020 10:52   CT CORONARY MORPH W/CTA COR W/SCORE W/CA W/CM &/OR WO/CM  Addendum Date: 07/16/2020   ADDENDUM REPORT: 07/16/2020 13:31 EXAM: OVER-READ INTERPRETATION  CT CHEST The following report is an over-read performed by radiologist Dr. Samara [facility] [place] on 07/16/2020. This over-read does not include interpretation of cardiac or coronary anatomy or pathology. The coronary CTA interpretation by the cardiologist is attached. COMPARISON:   None. FINDINGS: Please see the separate concurrent chest CT angiogram report for details. IMPRESSION: Please see the separate concurrent chest CT angiogram report for details. Electronically Signed   By: Ilona Sorrel M.D.   On: 07/16/2020 13:31   Result Date: 07/16/2020 MEDICATIONS: MEDICATIONS None CLINICAL DATA:  Aortic stenosis EXAM: Cardiac TAVR CT TECHNIQUE: The patient was scanned on a Siemens Force [id number] slice scanner. A 120 kV retrospective scan was triggered in the descending thoracic aorta at 111 HU's. Gantry rotation speed was 270 msecs and collimation was .9 mm. No beta blockade or nitro were given. The 3D data set was reconstructed in 5% intervals of the R-R cycle. Systolic and diastolic phases were analyzed on a dedicated work station using MPR, MIP and VRT modes. The patient received 80 cc of contrast. FINDINGS: Aortic Valve: Bicuspid calcified with restricted motion Sievers type one with raphe between right an non coronary cusps Thickened leaflet edges and raphe AV calcium score 2407 consistent with severe AS Aorta: No aneurysm only mild atherosclerosis near innominate take off Normal branch vessels no coarctation Sinotubular Junction: 23 mm Ascending Thoracic Aorta: 32 mm Aortic Arch: 25 mm Descending Thoracic Aorta: 23 mm Sinus of Valsalva Measurements: Non-coronary: 27 mm Right - coronary: 27 mm Left - coronary: 28 mm Coronary Artery Height above Annulus: Left Main: 15.5 mm above annulus Right Coronary: 13.3 mm above annulus Virtual Basal Annulus Measurements: Maximum/Minimum Diameter: 24.9 mm x 20.3 mm Perimeter: 70 mm Area: 387 mm 2 Coronary Arteries: Right dominant. Non obstructive <50% mixed plaque in proximal and mid LAD. 1-24% calcified plaque in proximal RCA and mid circumflex Calcium score: 63 which is 14 th percentile for age and sex IMPRESSION: 1. Bicuspid Aortic valve Sievers type one with severe AS and calcium score 2407 consistent with severe AS 2. Mild thoracic atherosclerosis with no  aneurysm, coarctation and normal arch vessels 3. Coronary calcium score 63 which is 34 th percentile for age / sex. Non obstructive CAD Right dominant coronary arteries 4. Measurement for TAVR given above but patient appears to be having SAVR Jenkins Rouge Electronically Signed: By: Jenkins Rouge M.D. On: 07/10/2020 17:28   DG Chest Port 1 View  Result Date: 07/31/2020 CLINICAL DATA:  Bypass surgery. EXAM: PORTABLE CHEST 1 VIEW COMPARISON:  07/30/2020 FINDINGS: The Swan-Ganz catheter has been removed. The left IJ Cordis is still in place. The mediastinal drain tubes Stable mild cardiac enlargement and mild central vascular congestion. Low lung volumes with vascular crowding and bibasilar atelectasis. No definite pleural effusions or pneumothorax. Have been removed. IMPRESSION: Low lung volumes with vascular crowding and bibasilar atelectasis. Electronically Signed   By: Marijo Sanes M.D.   On: 07/31/2020 08:31   DG Chest Port 1 View  Result Date: 07/30/2020 CLINICAL DATA:  Chest tube open-heart surgery. EXAM: PORTABLE CHEST 1 VIEW COMPARISON:  07/29/2020. FINDINGS: Patient rotated to the right. Interim extubation removal of NG tube. Swan-Ganz catheter, mediastinal  drainage catheters in stable position. Prior cardiac valve replacement. Cardiomegaly. Mild bibasilar atelectasis. No pleural effusion or pneumothorax. IMPRESSION: 1. Interim extubation removal of NG tube. Swan-Ganz catheter and mediastinal drainage catheters in stable position. 2.  Prior cardiac valve replacement.  Cardiomegaly. 3.  Mild bibasilar atelectasis. Electronically Signed   By: Marcello Moores  Register   On: 07/30/2020 07:56   DG Chest Port 1 View  Result Date: 07/29/2020 CLINICAL DATA:  Patient status post aortic valve replacement today. EXAM: PORTABLE CHEST 1 VIEW COMPARISON:  PA and lateral chest 07/27/2020. FINDINGS: Endotracheal tube tip is in good position just above the clavicular heads. Left IJ approach Swan-Ganz catheter tip is in  the distal pulmonary outflow tract. NG tube courses into the stomach and below the inferior margin the film. Mediastinal drain noted. Heart size is normal. Prosthetic aortic valve is now in place. Lungs are clear. No pneumothorax. IMPRESSION: Support tubes and lines as described. Lungs clear. Electronically Signed   By: Inge Rise M.D.   On: 07/29/2020 15:13   CT ANGIO CHEST AORTA W/CM & OR WO/CM  Addendum Date: 07/27/2020   ADDENDUM REPORT: 07/27/2020 08:11 ADDENDUM: TECHNIQUE: Multidetector CT imaging of the chest, abdomen and pelvis was performed after administration of 80 cc Omnipaque 350 IV with angiographic technique. Multiplanar maximum intensity projection (MIP) reformats and 3D renderings were generated using dedicated [place]. Electronically Signed   By: Ilona Sorrel M.D.   On: 07/27/2020 08:11   Result Date: 07/27/2020 CLINICAL DATA:  Aortic stenosis.  Pre-TAVR protocol. EXAM: CT CHEST, ABDOMEN AND PELVIS WITHOUT CONTRAST TECHNIQUE: Multidetector CT imaging of the chest, abdomen and pelvis was performed following the standard protocol without IV contrast. COMPARISON:  None. FINDINGS: CTA CHEST FINDINGS Cardiovascular: Normal heart size. No significant pericardial effusion/thickening. Left anterior descending coronary atherosclerosis. Diffusely coarsely calcified and thick and aortic root. Great vessels are normal in course and caliber. No central pulmonary emboli. Mediastinum/Nodes: No discrete thyroid nodules. Unremarkable esophagus. No pathologically enlarged axillary, mediastinal or hilar lymph nodes. Lungs/Pleura: No pneumothorax. No pleural effusion. No acute consolidative airspace disease, lung masses or significant pulmonary nodules. Musculoskeletal: No aggressive appearing focal osseous lesions. Mild thoracic spondylosis. CTA ABDOMEN AND PELVIS FINDINGS Hepatobiliary: Normal liver with no liver mass. Normal gallbladder with no radiopaque cholelithiasis. No biliary  ductal dilatation. Pancreas: Normal, with no mass or duct dilation. Spleen: Normal size. No mass. Adrenals/Urinary Tract: Normal adrenals. No contour deforming renal masses. No hydronephrosis. Normal bladder. Stomach/Bowel: Small hiatal hernia. Otherwise normal nondistended stomach. Normal caliber small bowel with no small bowel wall thickening. Normal appendix. Mild sigmoid diverticulosis with no large bowel wall thickening or significant pericolonic fat stranding. Vascular/Lymphatic: Mildly atherosclerotic nonaneurysmal abdominal aorta. Patent renal veins. No pathologically enlarged lymph nodes in the abdomen or pelvis. Reproductive: Normal size prostate. Other: No pneumoperitoneum, ascites or focal fluid collection. Musculoskeletal: No aggressive appearing focal osseous lesions. Mild lumbar spondylosis. VASCULAR MEASUREMENTS PERTINENT TO TAVR: AORTA: Minimal Aortic Diameter-12.7 x 12.0 mm Severity of Aortic Calcification-mild RIGHT PELVIS: Right Common Iliac Artery - Minimal Diameter-7.7 x 7.2 mm Tortuosity-mild Calcification-mild Right External Iliac Artery - Minimal Diameter-6.6 x 6.5 mm Tortuosity-mild Calcification-none Right Common Femoral Artery - Minimal Diameter-6.9 x 6.6 mm Tortuosity-mild Calcification-mild LEFT PELVIS: Left Common Iliac Artery - Minimal Diameter-7.5 x 7.3 mm Tortuosity-mild Calcification-mild Left External Iliac Artery - Minimal Diameter-6.8 x 6.6 mm Tortuosity-mild Calcification-none Left Common Femoral Artery - Minimal Diameter-7.5 x 5.8 mm Tortuosity-mild Calcification-moderate Review of the MIP images confirms the above findings. IMPRESSION: 1.  Vascular findings and measurements pertinent to potential TAVR procedure, as detailed. 2. Diffusely coarsely calcified and thickened aortic valve, compatible with reported history of aortic stenosis. 3. One vessel coronary atherosclerosis. 4. Small hiatal hernia. 5. Mild sigmoid diverticulosis. 6. Aortic Atherosclerosis (ICD10-I70.0).  Electronically Signed: By: Ilona Sorrel M.D. On: 07/11/2020 05:19   ECHO INTRAOPERATIVE TEE  Result Date: 07/29/2020  *INTRAOPERATIVE TRANSESOPHAGEAL REPORT *  Patient Name:   DAMARION STEVESON Date of Exam: 07/29/2020 Medical Rec #:  [id number]      Height:       66.0 in Accession #:    [id number]     Weight:       148.0 lb Date of Birth:  1966-02-13       BSA:          1.76 m Patient Age:    26 years       BP:           150/95 mmHg Patient Gender: M              HR:           85 bpm. Exam Location:  Anesthesiology Transesophogeal exam was perform intraoperatively during surgical procedure. Patient was closely monitored under general anesthesia during the entirety of examination. Indications:     Aortic stenosis Sonographer:     Dustin Flock RDCS Performing Phys: McBride Diagnosing Phys: Roberts Gaudy MD Complications: No known complications during this procedure. PRE-OP FINDINGS  Left Ventricle: The left ventricle has normal systolic function, with an ejection fraction of 55-60%. The cavity size was normal. There was moderate concentric LVH with a wall thickness of 1.20-1.25 cm. There was normal systolic LV function with the EF estimated at 55-60% by the 2DQ border recogniton software. There were no regional wall motion abnormalities. On the post-bypass exam, the LV systolic function was unchanged from the pre-bypass study with the caculated ejection fraction of 55-60%. Right Ventricle: The right ventricle has normal systolic function. The cavity was normal. There is no increase in right ventricular wall thickness. Left Atrium: Left atrial size was normal in size. The left atrial appendage is well visualized and there is evidence of thrombus present. Right Atrium: Right atrial size was normal in size. Interatrial Septum: No atrial level shunt detected by color flow Doppler. Pericardium: There is no evidence of pericardial effusion. Mitral Valve: The mitral valve is normal in structure. Mitral valve  regurgitation is mild by color flow Doppler. There is No evidence of mitral stenosis. Tricuspid Valve: The tricuspid valve was normal in structure. Tricuspid valve regurgitation is trivial by color flow Doppler. No evidence of tricuspid stenosis is present. Aortic Valve: There was severe aortic stenosis with an apparent bicuspid morphology. The leaflets were thickened with obvious restriction to opening. The peak velocity was 5,1 m/sec with a peak gradient of 104 mm hg and a mean gradient of 51 mm hg. The AVA by Vmax was  0.58 cm2 and by the VTI method was 0.57 cm2. The dimensionless index was 0.14. There was mild aortic insufficiency. The aortic annulus measured 2.1 cm On the post-bypass exam, there was a bioprosthetic valve in the aortic position. The leaflets opened normally without restriction and there was no aortic insufficiency. The peak velocity was 2.5 m/sec. with a mean gradient of 13 mm hg. The post-bypass dimensionless index was 0.39. Pulmonic Valve: The pulmonic valve was normal in structure. No evidence of pulmonic stenosis. Pulmonic valve regurgitation is trivial by color flow  Doppler. Aorta: The aortic root and proximal ascending aorta had a normal contour without dilatation or effacement. There was mild intimal thickening but no protruding atheromatous disease. Aortic root at sinuses of valsalva: 2.62 cm Sino-tubular junction: 1.9 cm Proximal ascending aorta: 1.9 cm The desending thoracic aorta appeared normal and measured 1.88 cm. Shunts: There is no evidence of an atrial septal defect. +--------------+--------++ LEFT VENTRICLE         +--------------+--------++ PLAX 2D                +--------------+--------++ LVOT diam:    2.20 cm  +--------------+--------++ LVOT Area:    3.80 cm +--------------+--------++                        +--------------+--------++ +------------------+------------++ AORTIC VALVE                   +------------------+------------++ AV Area (Vmax):    1.04 cm     +------------------+------------++ AV Area (Vmean):  1.21 cm     +------------------+------------++ AV Area (VTI):    1.09 cm     +------------------+------------++ AV Vmax:          315.25 cm/s  +------------------+------------++ AV Vmean:         196.750 cm/s +------------------+------------++ AV VTI:           0.598 m      +------------------+------------++ AV Peak Grad:     39.8 mmHg    +------------------+------------++ AV Mean Grad:     21.5 mmHg    +------------------+------------++ LVOT Vmax:        86.15 cm/s   +------------------+------------++ LVOT Vmean:       62.750 cm/s  +------------------+------------++ LVOT VTI:         0.172 m      +------------------+------------++ LVOT/AV VTI ratio:0.29         +------------------+------------++  +--------------+-------+ SHUNTS                +--------------+-------+ Systemic VTI: 0.17 m  +--------------+-------+ Systemic Diam:2.20 cm +--------------+-------+  Roberts Gaudy MD Electronically signed by Roberts Gaudy MD Signature Date/Time: 07/29/2020/7:43:03 PM    Final    VAS US DOPPLER PRE CABG  Result Date: 07/28/2020 PREOPERATIVE VASCULAR EVALUATION  Indications:      Pre-CABG. Risk Factors:     Hypertension, hyperlipidemia. Comparison Study: no prior Performing Technologist: Abram Sander RVS  Examination Guidelines: A complete evaluation includes B-mode imaging, spectral Doppler, color Doppler, and power Doppler as needed of all accessible portions of each vessel. Bilateral testing is considered an integral part of a complete examination. Limited examinations for reoccurring indications may be performed as noted.  Right Carotid Findings: +----------+--------+--------+--------+------------+--------+           PSV cm/sEDV cm/sStenosisDescribe    Comments +----------+--------+--------+--------+------------+--------+ CCA Prox  71      17              heterogenous          +----------+--------+--------+--------+------------+--------+ CCA Distal56      18              heterogenous         +----------+--------+--------+--------+------------+--------+ ICA Prox  47      13      1-39%   heterogenous         +----------+--------+--------+--------+------------+--------+ ICA Distal73      29                                   +----------+--------+--------+--------+------------+--------+  ECA       67      15                                   +----------+--------+--------+--------+------------+--------+ Portions of this table do not appear on this page. +----------+--------+-------+--------+------------+           PSV cm/sEDV cmsDescribeArm Pressure +----------+--------+-------+--------+------------+ Subclavian82                                  +----------+--------+-------+--------+------------+ +---------+--------+--+--------+--+---------+ VertebralPSV cm/s37EDV cm/s11Antegrade +---------+--------+--+--------+--+---------+ Left Carotid Findings: +----------+--------+--------+--------+------------+--------+           PSV cm/sEDV cm/sStenosisDescribe    Comments +----------+--------+--------+--------+------------+--------+ CCA Prox  76      29              heterogenous         +----------+--------+--------+--------+------------+--------+ CCA Distal70      25              heterogenous         +----------+--------+--------+--------+------------+--------+ ICA Prox  61      23      1-39%   heterogenous         +----------+--------+--------+--------+------------+--------+ ICA Distal64      27                                   +----------+--------+--------+--------+------------+--------+ ECA       78      18                                   +----------+--------+--------+--------+------------+--------+ +----------+--------+--------+--------+------------+ SubclavianPSV cm/sEDV cm/sDescribeArm Pressure  +----------+--------+--------+--------+------------+           58                                   +----------+--------+--------+--------+------------+ +---------+--------+--+--------+--+---------+ VertebralPSV cm/s22EDV cm/s11Antegrade +---------+--------+--+--------+--+---------+  ABI Findings: +--------+------------------+-----+---------+--------+ Right   Rt Pressure (mmHg)IndexWaveform Comment  +--------+------------------+-----+---------+--------+ Brachial                       triphasic         +--------+------------------+-----+---------+--------+  Right Doppler Findings: +--------+--------+-----+---------+--------+ Site    PressureIndexDoppler  Comments +--------+--------+-----+---------+--------+ Brachial             triphasic         +--------+--------+-----+---------+--------+ Radial               triphasic         +--------+--------+-----+---------+--------+ Ulnar                triphasic         +--------+--------+-----+---------+--------+  Left Doppler Findings: +----------+--------+-----+---------+--------+ Site      PressureIndexDoppler  Comments +----------+--------+-----+---------+--------+ Subclavian             triphasic         +----------+--------+-----+---------+--------+ Radial                 triphasic         +----------+--------+-----+---------+--------+ Ulnar                  triphasic         +----------+--------+-----+---------+--------+  Summary: Right Carotid: Velocities in the right ICA are consistent with a 1-39% stenosis. Left Carotid: Velocities in the left ICA are consistent with a 1-39% stenosis. Vertebrals: Bilateral vertebral arteries demonstrate antegrade flow. Right Upper Extremity: Doppler waveforms decrease 50% with right radial compression. Doppler waveforms remain within normal limits with right ulnar compression. Left Upper Extremity: Doppler waveforms remain within normal limits with left radial  compression. Doppler waveform obliterate with left ulnar compression.  Electronically signed by Curt Jews MD on 07/28/2020 at 5:13:19 PM.    Final    CT Angio Abd/Pel w/ and/or w/o  Addendum Date: 07/27/2020   ADDENDUM REPORT: 07/27/2020 08:11 ADDENDUM: TECHNIQUE: Multidetector CT imaging of the chest, abdomen and pelvis was performed after administration of 80 cc Omnipaque 350 IV with angiographic technique. Multiplanar maximum intensity projection (MIP) reformats and 3D renderings were generated using dedicated [employer]. Electronically Signed   By: Ilona Sorrel M.D.   On: 07/27/2020 08:11   Result Date: 07/27/2020 CLINICAL DATA:  Aortic stenosis.  Pre-TAVR protocol. EXAM: CT CHEST, ABDOMEN AND PELVIS WITHOUT CONTRAST TECHNIQUE: Multidetector CT imaging of the chest, abdomen and pelvis was performed following the standard protocol without IV contrast. COMPARISON:  None. FINDINGS: CTA CHEST FINDINGS Cardiovascular: Normal heart size. No significant pericardial effusion/thickening. Left anterior descending coronary atherosclerosis. Diffusely coarsely calcified and thick and aortic root. Great vessels are normal in course and caliber. No central pulmonary emboli. Mediastinum/Nodes: No discrete thyroid nodules. Unremarkable esophagus. No pathologically enlarged axillary, mediastinal or hilar lymph nodes. Lungs/Pleura: No pneumothorax. No pleural effusion. No acute consolidative airspace disease, lung masses or significant pulmonary nodules. Musculoskeletal: No aggressive appearing focal osseous lesions. Mild thoracic spondylosis. CTA ABDOMEN AND PELVIS FINDINGS Hepatobiliary: Normal liver with no liver mass. Normal gallbladder with no radiopaque cholelithiasis. No biliary ductal dilatation. Pancreas: Normal, with no mass or duct dilation. Spleen: Normal size. No mass. Adrenals/Urinary Tract: Normal adrenals. No contour deforming renal masses. No hydronephrosis. Normal bladder. Stomach/Bowel: Small  hiatal hernia. Otherwise normal nondistended stomach. Normal caliber small bowel with no small bowel wall thickening. Normal appendix. Mild sigmoid diverticulosis with no large bowel wall thickening or significant pericolonic fat stranding. Vascular/Lymphatic: Mildly atherosclerotic nonaneurysmal abdominal aorta. Patent renal veins. No pathologically enlarged lymph nodes in the abdomen or pelvis. Reproductive: Normal size prostate. Other: No pneumoperitoneum, ascites or focal fluid collection. Musculoskeletal: No aggressive appearing focal osseous lesions. Mild lumbar spondylosis. VASCULAR MEASUREMENTS PERTINENT TO TAVR: AORTA: Minimal Aortic Diameter-12.7 x 12.0 mm Severity of Aortic Calcification-mild RIGHT PELVIS: Right Common Iliac Artery - Minimal Diameter-7.7 x 7.2 mm Tortuosity-mild Calcification-mild Right External Iliac Artery - Minimal Diameter-6.6 x 6.5 mm Tortuosity-mild Calcification-none Right Common Femoral Artery - Minimal Diameter-6.9 x 6.6 mm Tortuosity-mild Calcification-mild LEFT PELVIS: Left Common Iliac Artery - Minimal Diameter-7.5 x 7.3 mm Tortuosity-mild Calcification-mild Left External Iliac Artery - Minimal Diameter-6.8 x 6.6 mm Tortuosity-mild Calcification-none Left Common Femoral Artery - Minimal Diameter-7.5 x 5.8 mm Tortuosity-mild Calcification-moderate Review of the MIP images confirms the above findings. IMPRESSION: 1. Vascular findings and measurements pertinent to potential TAVR procedure, as detailed. 2. Diffusely coarsely calcified and thickened aortic valve, compatible with reported history of aortic stenosis. 3. One vessel coronary atherosclerosis. 4. Small hiatal hernia. 5. Mild sigmoid diverticulosis. 6. Aortic Atherosclerosis (ICD10-I70.0). Electronically Signed: By: Ilona Sorrel M.D. On: 07/11/2020 05:19   Discharge Instructions    Amb Referral to Cardiac Rehabilitation   Complete by: As directed    Diagnosis: Valve Replacement   Valve: Aortic   After initial  evaluation and assessments completed: Virtual Based Care may be provided alone or in conjunction with Phase 2 Cardiac Rehab based on patient barriers.: Yes      Discharge Medications: Allergies as of 08/02/2020      Reactions   Percocet [oxycodone-acetaminophen] Hives      Medication List    TAKE these medications   allopurinol 300 MG tablet Commonly known as: ZYLOPRIM Take 300 mg by mouth daily.   aspirin 325 MG EC tablet Take 1 tablet (325 mg total) by mouth daily.   colchicine 0.6 MG tablet Take 0.6 mg by mouth daily as needed (Gout).   ezetimibe 10 MG tablet Commonly known as: ZETIA Take 1 tablet (10 mg total) by mouth daily.   metoprolol tartrate 25 MG tablet Commonly known as: LOPRESSOR Take 1 tablet (25 mg total) by mouth 2 (two) times daily.   omeprazole 20 MG capsule Commonly known as: PRILOSEC Take 20 mg by mouth daily.   rosuvastatin 20 MG tablet Commonly known as: CRESTOR Take 20 mg by mouth daily.   traMADol 50 MG tablet Commonly known as: ULTRAM Take 1 tablet (50 mg total) by mouth every 6 (six) hours as needed for moderate pain.   valsartan 40 MG tablet Commonly known as: Diovan Take 1 tablet (40 mg total) by mouth daily.      The patient has been discharged on:   1.Beta Blocker:  Yes [ x  ]                              No   [   ]                              If No, reason:  2.Ace Inhibitor/ARB: Yes [ x  ]                                     No  [    ]                                     If No, reason:  3.Statin:   Yes [ x  ]                  No  [   ]                  If No, reason:  4.Ecasa:  Yes  [ x  ]                  No   [   ]                  If No, reason:  Follow Up Appointments:  Follow-up Information    Freada Bergeron, MD. Go on 09/01/2020.   Specialties: Cardiology, Radiology Why: Appointment time is at 2:00 pm Contact information: [id number] [address] [phone]         [facility] [place]. Go on 08/31/2020.   Specialty: Cardiothoracic Surgery Why: PA/LAT CXR to be taken (at [place] which is in the same building as Dr. Guy Sandifer office) on 05/23 at 12:30 pm;Appointment time is at  1:00 pm Contact information: [place], [place] [facility]. Go on 09/10/2020.   Specialty: Cardiology Why: Appointment time is at 10:30 am Contact information: [id number] [place] [phone]              Signed: Joline Maxcy 08/02/2020, 9:38 AM

## 2020-07-31 NOTE — Progress Notes (Addendum)
TCTS DAILY ICU PROGRESS NOTE                   [address]            [place] [address]          [phone]   2 Days Post-Op Procedure(s) (LRB): AORTIC VALVE REPLACEMENT (AVR) USING 21MM INSPIRIS RESILIA  AORTIC VALVE (N/A) TRANSESOPHAGEAL ECHOCARDIOGRAM (TEE) (N/A) AORTIC ROOT ENLARGEMENT USING 6CMX8CM PERI-GUARD PERICARDIAL PATCH  Total Length of Stay:  LOS: 2 days   Subjective:  Up in the bedside chair, walked a lap in the unit this morning. Pain is well controlled.  Passing gas, no BM yet.  O2 at 2L/[place].  Objective: Vital signs in last 24 hours: Temp:  [98 F (36.7 C)-99.86 F (37.7 C)] 98.2 F (36.8 C) (04/21 1535) Pulse Rate:  [86-94] 94 (04/22 0400) Cardiac Rhythm: Normal sinus rhythm (04/22 0400) Resp:  [17-25] 20 (04/22 0400) BP: (96-166)/(52-103) 134/94 (04/22 0400) SpO2:  [88 %-100 %] 96 % (04/22 0400) Arterial Line BP: (104-139)/(48-70) 139/70 (04/21 1400) FiO2 (%):  [40 %] 40 % (04/21 0800) Weight:  [70.8 kg] 70.8 kg (04/22 0500)  Filed Weights   07/29/20 0712 07/30/20 0615 07/31/20 0500  Weight: 67.1 kg 70.7 kg 70.8 kg    Weight change: 3.668 kg   Hemodynamic parameters for last 24 hours: PAP: (37)/(17) 37/17  Intake/Output from previous day: 04/21 0701 - 04/22 0700 In: 104.4 [I.V.:4.4; IV Piggyback:100] Out: 920 [Urine:860; Chest Tube:60]  Intake/Output this shift: No intake/output data recorded.  Current Meds: Scheduled Meds: . acetaminophen  1,000 mg Oral Q6H  . allopurinol  300 mg Oral Daily  . aspirin EC  325 mg Oral Daily  . bisacodyl  10 mg Oral Daily   Or  . bisacodyl  10 mg Rectal Daily  . Chlorhexidine Gluconate Cloth  6 each Topical Daily  .  Cardiac Surgery, Patient & Family Education   Does not apply Once  . docusate sodium  200 mg Oral Daily  . enoxaparin (LOVENOX) injection  30 mg Subcutaneous QHS  . [START ON 08/02/2020] ezetimibe  10 mg Oral Daily  . ferrous [id number] C-folic acid   1 capsule Oral Q breakfast  . furosemide  40 mg Intravenous BID  . irbesartan  75 mg Oral Daily  . ketorolac  15 mg Intravenous Q6H  . metoprolol tartrate  25 mg Oral BID  . pantoprazole  40 mg Oral Daily  . potassium chloride  40 mEq Oral BID  . [START ON 08/02/2020] rosuvastatin  20 mg Oral Daily  . sodium chloride flush  10-40 mL Intracatheter Q12H  . sodium chloride flush  3 mL Intravenous Q12H   Continuous Infusions: . sodium chloride    . cefUROXime (ZINACEF)  IV 1.5 g (07/30/20 2041)  . lactated ringers    . lactated ringers     PRN Meds:.metoprolol tartrate, morphine injection, ondansetron (ZOFRAN) IV, oxyCODONE, sodium chloride flush, sodium chloride flush, traMADol  General appearance: alert, cooperative and no distress Neurologic: intact Heart: NSR, soft systolic murmur c/w bioprosthetic valve.  Lungs: clear to auscultation bilaterally. CXR showing some volume loss c/w ATX. Abdomen: not as firm today, non-tender.  Extremities: all warm and well perfused Wound: the sternotomy is covered with an  Aquacel dressing with minor spotting.   Lab Results: CBC: Recent Labs    07/30/20 1814 07/31/20 0401  WBC 12.4* 11.6*  HGB 9.2* 8.9*  HCT 27.5* 26.4*  PLT  84* 90*   BMET:  Recent Labs    07/30/20 1814 07/31/20 0401  NA 135 136  K 4.0 3.9  CL 105 103  CO2 24 26  GLUCOSE 137* 110*  BUN 16 15  CREATININE 1.20 1.08  CALCIUM 8.5* 8.6*    CMET: Lab Results  Component Value Date   WBC 11.6 (H) 07/31/2020   HGB 8.9 (L) 07/31/2020   HCT 26.4 (L) 07/31/2020   PLT 90 (L) 07/31/2020   GLUCOSE 110 (H) 07/31/2020   ALT 33 07/29/2020   AST 75 (H) 07/29/2020   NA 136 07/31/2020   K 3.9 07/31/2020   CL 103 07/31/2020   CREATININE 1.08 07/31/2020   BUN 15 07/31/2020   CO2 26 07/31/2020   INR 1.3 (H) 07/29/2020   HGBA1C 5.7 (H) 07/27/2020      PT/INR:  Recent Labs    07/29/20 1439  LABPROT 16.3*  INR 1.3*   Radiology: No results  found.   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 Stable cardiac rhythm and hemodynamics and he is progressing well with mobility. Will d/c the cordis and Foley catheter today.CT's are out. Transfer to 4E. Advance activity and work on pulmonary hygiene.   -Expected acute blood loss anemia- stable and well tolerated. Add Trinsicon. Monitor.   -Endo- no h/o DM, glucose well controlled.   -Volume excess- Wt. 3.5kg positive. Begin diuresis  -Hypertension- Starting metoprolol and irbesartan. Monitor.   -DVT PPX- ambulate, start SQ enoxaparin this PM.     Antony Odea, PA-C [phone] 07/31/2020 7:33 AM   I have seen and examined the patient and agree with the assessment and plan as outlined.  Doing well POD2.  Start metoprolol and restart home dose ARB.  Mobilize.  Diuresis.  Transfer 4E  Rexene Alberts, MD 07/31/2020 9:15 AM

## 2020-07-31 NOTE — Progress Notes (Signed)
CARDIAC REHAB PHASE I   PRE:  Rate/Rhythm: 88 SR  BP:  Sitting: 127/91      SaO2: 90 RA  MODE:  Ambulation: 370 ft   POST:  Rate/Rhythm: 98 SR  BP:  Sitting: 101/68    SaO2: 95 RA  Pt ambulated 382ft in hallway standby assist with EVA. Pt denies CP, SOB, or dizziness. Returned to [employer]. Demonstrating 750 on IS. Encouraged continued IS use and walks. Will continue to follow.  [phone] Rufina Falco, RN BSN 07/31/2020 11:22 AM

## 2020-08-01 ENCOUNTER — Inpatient Hospital Stay (HOSPITAL_COMMUNITY): Payer: Commercial Managed Care - PPO

## 2020-08-01 LAB — BASIC METABOLIC PANEL
Anion gap: 6 (ref 5–15)
BUN: 13 mg/dL (ref 6–20)
CO2: 29 mmol/L (ref 22–32)
Calcium: 8.8 mg/dL — ABNORMAL LOW (ref 8.9–10.3)
Chloride: 104 mmol/L (ref 98–111)
Creatinine, Ser: 1.11 mg/dL (ref 0.61–1.24)
GFR, Estimated: >60 mL/min (ref >60)
Glucose, Bld: 116 mg/dL — ABNORMAL HIGH (ref 70–99)
Potassium: 4.7 mmol/L (ref 3.5–5.1)
Sodium: 139 mmol/L (ref 135–145)

## 2020-08-01 LAB — CBC
HCT: 25.8 % — ABNORMAL LOW (ref 39.0–52.0)
Hemoglobin: 8.6 g/dL — ABNORMAL LOW (ref 13.0–17.0)
MCH: 32 pg (ref 26.0–34.0)
MCHC: 33.3 g/dL (ref 30.0–36.0)
MCV: 95.9 fL (ref 80.0–100.0)
Platelets: 94 10*3/uL — ABNORMAL LOW (ref 150–400)
RBC: 2.69 MIL/uL — ABNORMAL LOW (ref 4.22–5.81)
RDW: 13.2 % (ref 11.5–15.5)
WBC: 11.1 10*3/uL — ABNORMAL HIGH (ref 4.0–10.5)
nRBC: 0 % (ref 0.0–0.2)

## 2020-08-01 LAB — MAGNESIUM: Magnesium: 2.1 mg/dL (ref 1.7–2.4)

## 2020-08-01 MED ORDER — FUROSEMIDE 40 MG PO TABS
40.0000 mg | ORAL_TABLET | Freq: Two times a day (BID) | ORAL | Status: AC
  Administered 2020-08-01 (×2): 40 mg via ORAL
  Filled 2020-08-01: qty 1

## 2020-08-01 MED ORDER — ENOXAPARIN SODIUM 40 MG/0.4ML ~~LOC~~ SOLN
40.0000 mg | Freq: Every day | SUBCUTANEOUS | Status: DC
  Administered 2020-08-01: 40 mg via SUBCUTANEOUS
  Filled 2020-08-01: qty 0.4

## 2020-08-01 NOTE — Progress Notes (Signed)
Mobility Specialist: Progress Note   08/01/20 1325  Mobility  Activity Ambulated in hall  Level of Assistance Independent after set-up  Assistive Device Four wheel walker  Distance Ambulated (ft) 470 ft  Mobility Response Tolerated well  Mobility performed by Mobility specialist  $Mobility charge 1 Mobility   Pre-Mobility: 96 HR During Mobility: 101 HR, 90% SpO2 Post-Mobility: 101 HR, 114/82 BP, 97% SpO2  Pt to BR and agreeable to ambulate after. Pt visibly SOB during ambulation. Pt c/o some chest pain, no rating given, he said is from a coughing spell he had earlier. Pt back to bed after walk. Encouraged pt to walk at least one more time today.   [facility] Rage Beever Mobility Specialist Mobility Specialist Phone: [phone]

## 2020-08-01 NOTE — Progress Notes (Signed)
EPWs removed per order.  Pt tolerated well, all tips intact, sites unremarkable.  Pt understands bedrest for one hr, with frequent VS.  CCMD notified, will monitor closely.

## 2020-08-01 NOTE — Progress Notes (Signed)
CARDIAC REHAB PHASE I   PRE:  Rate/Rhythm: SR / 99  BP:  Sitting: 89/66  Standing: 106/71      SaO2: 96%  MODE:  Ambulation: 470 ft    POST:  Rate/Rhythm: SR / 98  BP:  Sitting: 122/79      SaO2: 97%  Pt received in bed. Pt noted with low SBP while sitting, asymptomatic. BP WNL upon standing, no dizziness. Pt ambulated in hallway with steady gait using rolling walker. Pt took a rest break due to feeling some SOB, SaO2 = 95%. Pt had no c/o dizziness or pain with walk. Pt back to chair with call bell at side and break on. Provided post-op OHS education to patient and spouse. Reviewed "move in the tube" concept. Provided post OHS booklet. Reviewed wound care and activity limitations. Stressed daily weights and medication compliance, f/u with MD. Referral to [place] program made for [facility]. I/S =1000 mL, encouraged 10x/hr. Reviewed walking at home and provided instructions. Pt and spouse verbalized understanding of post-op education and questions answered.   Lesly Rubenstein, MS, ACSM EP-C, [facility] 08/01/2020   9:42 -11:01

## 2020-08-01 NOTE — Progress Notes (Addendum)
TCTS DAILY ICU PROGRESS NOTE                   [address]            [place] [address]          [phone]   3 Days Post-Op Procedure(s) (LRB): AORTIC VALVE REPLACEMENT (AVR) USING 21MM INSPIRIS RESILIA  AORTIC VALVE (N/A) TRANSESOPHAGEAL ECHOCARDIOGRAM (TEE) (N/A) AORTIC ROOT ENLARGEMENT USING 6CMX8CM PERI-GUARD PERICARDIAL PATCH  Total Length of Stay:  LOS: 3 days   Subjective:  Resting in bed. Walked in the hall this morning.  Pain is well controlled.  Passing gas, no BM yet.  Now on RA, sats acceptable.  Objective: Vital signs in last 24 hours: Temp:  [98.1 F (36.7 C)-99.3 F (37.4 C)] 98.1 F (36.7 C) (04/23 0401) Pulse Rate:  [88-100] 100 (04/23 0401) Cardiac Rhythm: Normal sinus rhythm (04/23 0700) Resp:  [15-23] 18 (04/23 0401) BP: (108-137)/(52-95) 135/95 (04/23 0401) SpO2:  [88 %-98 %] 98 % (04/23 0401) Weight:  [70.8 kg] 70.8 kg (04/23 0401)  Filed Weights   07/30/20 0615 07/31/20 0500 08/01/20 0401  Weight: 70.7 kg 70.8 kg 70.8 kg    Weight change: 0 kg     Intake/Output from previous day: 04/22 0701 - 04/23 0700 In: 3 [I.V.:3] Out: 3790 [Urine:3790]  Intake/Output this shift: No intake/output data recorded.  Current Meds: Scheduled Meds: . acetaminophen  1,000 mg Oral Q6H  . allopurinol  300 mg Oral Daily  . aspirin EC  325 mg Oral Daily  . bisacodyl  10 mg Oral Daily   Or  . bisacodyl  10 mg Rectal Daily  . Chlorhexidine Gluconate Cloth  6 each Topical Daily  . [place] Cardiac Surgery, Patient & Family Education   Does not apply Once  . docusate sodium  200 mg Oral Daily  . enoxaparin (LOVENOX) injection  30 mg Subcutaneous QHS  . [START ON 08/02/2020] ezetimibe  10 mg Oral Daily  . ferrous [id number] C-folic acid  1 capsule Oral Q breakfast  . irbesartan  75 mg Oral Daily  . metoprolol tartrate  25 mg Oral BID  . pantoprazole  40 mg Oral Daily  . [START ON 08/02/2020] rosuvastatin  20 mg Oral Daily  .  sodium chloride flush  10-40 mL Intracatheter Q12H  . sodium chloride flush  3 mL Intravenous Q12H   Continuous Infusions: . sodium chloride    . lactated ringers    . lactated ringers     PRN Meds:.metoprolol tartrate, morphine injection, ondansetron (ZOFRAN) IV, oxyCODONE, sodium chloride flush, sodium chloride flush, traMADol  General appearance: alert, cooperative and no distress Neurologic: intact Heart: NSR, soft systolic murmur c/w bioprosthetic valve.  Lungs: clear to auscultation bilaterally. CXR showing some volume loss c/w ATX. Abdomen: not as firm today, non-tender.  Extremities: all warm and well perfused, no edema. Wound: the Aquacel dressing was removed from the sternotomy, has a few areas of skin edge oozing. Sterile gauze dressing applied.   Lab Results: CBC: Recent Labs    07/31/20 0401 08/01/20 0220  WBC 11.6* 11.1*  HGB 8.9* 8.6*  HCT 26.4* 25.8*  PLT 90* 94*   BMET:  Recent Labs    07/31/20 0401 08/01/20 0220  NA 136 139  K 3.9 4.7  CL 103 104  CO2 26 29  GLUCOSE 110* 116*  BUN 15 13  CREATININE 1.08 1.11  CALCIUM 8.6* 8.8*    CMET: Lab Results  Component Value Date   WBC 11.1 (H) 08/01/2020   HGB 8.6 (L) 08/01/2020   HCT 25.8 (L) 08/01/2020   PLT 94 (L) 08/01/2020   GLUCOSE 116 (H) 08/01/2020   ALT 33 07/29/2020   AST 75 (H) 07/29/2020   NA 139 08/01/2020   K 4.7 08/01/2020   CL 104 08/01/2020   CREATININE 1.11 08/01/2020   BUN 13 08/01/2020   CO2 29 08/01/2020   INR 1.3 (H) 07/29/2020   HGBA1C 5.7 (H) 07/27/2020      PT/INR:  Recent Labs    07/29/20 1439  LABPROT 16.3*  INR 1.3*   Radiology: DG Chest 2 View  Result Date: 08/01/2020 CLINICAL DATA:  Status post aortic valve replacement with bioprosthetic valve. EXAM: CHEST - 2 VIEW COMPARISON:  07/31/20 FINDINGS: Stable cardiomediastinal contours. Small bilateral pleural effusions and bibasilar atelectasis, unchanged. No airspace consolidation. IMPRESSION: No change in  small bilateral pleural effusions with bibasilar atelectasis. Electronically Signed   By: Kerby Moors M.D.   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 Stable cardiac rhythm and hemodynamics and he is progressing well with mobility. Will d/c the pacer wires, continue working on ambulation. Possible discharge in AM.  -Expected acute blood loss anemia- stable and well tolerated. Continue Trinsicon. Monitor.   -Endo- no h/o DM, glucose well controlled.   -Volume excess- good response to Lasix yesterday, continue diuresis  -Hypertension- controlled on metoprolol and irbesartan. Monitor.   -DVT PPX- continue enoxaparin.    Antony Odea, PA-C [phone] 08/01/2020 8:32 AM   Chart reviewed, patient examined, agree with above. He looks good overall. Wt is still 8 lbs over preop. Will continue po today.

## 2020-08-02 LAB — BASIC METABOLIC PANEL
Anion gap: 9 (ref 5–15)
BUN: 12 mg/dL (ref 6–20)
CO2: 27 mmol/L (ref 22–32)
Calcium: 8.9 mg/dL (ref 8.9–10.3)
Chloride: 102 mmol/L (ref 98–111)
Creatinine, Ser: 1.12 mg/dL (ref 0.61–1.24)
GFR, Estimated: >60 mL/min (ref >60)
Glucose, Bld: 116 mg/dL — ABNORMAL HIGH (ref 70–99)
Potassium: 3.9 mmol/L (ref 3.5–5.1)
Sodium: 138 mmol/L (ref 135–145)

## 2020-08-02 LAB — CBC
HCT: 27.1 % — ABNORMAL LOW (ref 39.0–52.0)
Hemoglobin: 9 g/dL — ABNORMAL LOW (ref 13.0–17.0)
MCH: 32.1 pg (ref 26.0–34.0)
MCHC: 33.2 g/dL (ref 30.0–36.0)
MCV: 96.8 fL (ref 80.0–100.0)
Platelets: 124 10*3/uL — ABNORMAL LOW (ref 150–400)
RBC: 2.8 MIL/uL — ABNORMAL LOW (ref 4.22–5.81)
RDW: 13 % (ref 11.5–15.5)
WBC: 9 10*3/uL (ref 4.0–10.5)
nRBC: 0 % (ref 0.0–0.2)

## 2020-08-02 MED ORDER — METOPROLOL TARTRATE 25 MG PO TABS: 25.0000 mg | ORAL_TABLET | Freq: Two times a day (BID) | ORAL | 2 refills | Status: DC

## 2020-08-02 MED ORDER — TRAMADOL HCL 50 MG PO TABS: 50.0000 mg | ORAL_TABLET | Freq: Four times a day (QID) | ORAL | 0 refills | Status: DC | PRN

## 2020-08-02 MED ORDER — ASPIRIN 325 MG PO TBEC: 325.0000 mg | DELAYED_RELEASE_TABLET | Freq: Every day | ORAL | 0 refills | Status: AC

## 2020-08-02 NOTE — Progress Notes (Signed)
TCTS DAILY ICU PROGRESS NOTE                   [address]            [place] [address]          [phone]   4 Days Post-Op Procedure(s) (LRB): AORTIC VALVE REPLACEMENT (AVR) USING 21MM INSPIRIS RESILIA  AORTIC VALVE (N/A) TRANSESOPHAGEAL ECHOCARDIOGRAM (TEE) (N/A) AORTIC ROOT ENLARGEMENT USING 6CMX8CM PERI-GUARD PERICARDIAL PATCH  Total Length of Stay:  LOS: 4 days   Subjective:  Says he feels great today. Denies pain or shortness of breath. Independent with mobility. BM yesterday.   Objective: Vital signs in last 24 hours: Temp:  [97.9 F (36.6 C)-99.6 F (37.6 C)] 97.9 F (36.6 C) (04/24 0837) Pulse Rate:  [87-100] 90 (04/24 0837) Cardiac Rhythm: Normal sinus rhythm (04/23 1910) Resp:  [16-20] 20 (04/24 0837) BP: (98-116)/(68-82) 103/78 (04/24 0837) SpO2:  [92 %-98 %] 95 % (04/24 0837) Weight:  [65 kg] 65 kg (04/24 0455)  Filed Weights   07/31/20 0500 08/01/20 0401 08/02/20 0455  Weight: 70.8 kg 70.8 kg 65 kg    Weight change: -5.754 kg     Intake/Output from previous day: 04/23 0701 - 04/24 0700 In: 240 [P.O.:240] Out: 1980 [Urine:1980]  Intake/Output this shift: No intake/output data recorded.  Current Meds: Scheduled Meds: . acetaminophen  1,000 mg Oral Q6H  . allopurinol  300 mg Oral Daily  . aspirin EC  325 mg Oral Daily  . bisacodyl  10 mg Oral Daily   Or  . bisacodyl  10 mg Rectal Daily  . Chlorhexidine Gluconate Cloth  6 each Topical Daily  . [facility] Patient [facility] Family Education   Does not apply Once  . docusate sodium  200 mg Oral Daily  . enoxaparin (LOVENOX) injection  40 mg Subcutaneous QHS  . ezetimibe  10 mg Oral Daily  . ferrous [id number] C-folic acid  1 capsule Oral Q breakfast  . irbesartan  75 mg Oral Daily  . metoprolol tartrate  25 mg Oral BID  . pantoprazole  40 mg Oral Daily  . rosuvastatin  20 mg Oral Daily  . sodium chloride flush  10-40 mL Intracatheter Q12H  . sodium  chloride flush  3 mL Intravenous Q12H   Continuous Infusions: . sodium chloride    . lactated ringers    . lactated ringers     PRN Meds:.metoprolol tartrate, morphine injection, ondansetron (ZOFRAN) IV, oxyCODONE, sodium chloride flush, sodium chloride flush, traMADol  General appearance: alert, cooperative and no distress Neurologic: intact Heart: NSR, soft systolic murmur c/w bioprosthetic valve.  Lungs: clear to auscultation bilaterally.  Abdomen: soft,NT Extremities: all warm and well perfused, no edema. Wound: the sternotomy incision is well approximated and dry.   Lab Results: CBC: Recent Labs    08/01/20 0220 08/02/20 0054  WBC 11.1* 9.0  HGB 8.6* 9.0*  HCT 25.8* 27.1*  PLT 94* 124*   BMET:  Recent Labs    08/01/20 0220 08/02/20 0054  NA 139 138  K 4.7 3.9  CL 104 102  CO2 29 27  GLUCOSE 116* 116*  BUN 13 12  CREATININE 1.11 1.12  CALCIUM 8.8* 8.9    CMET: Lab Results  Component Value Date   WBC 9.0 08/02/2020   HGB 9.0 (L) 08/02/2020   HCT 27.1 (L) 08/02/2020   PLT 124 (L) 08/02/2020   GLUCOSE 116 (H) 08/02/2020   ALT 33 07/29/2020  AST 75 (H) 07/29/2020   NA 138 08/02/2020   K 3.9 08/02/2020   CL 102 08/02/2020   CREATININE 1.12 08/02/2020   BUN 12 08/02/2020   CO2 27 08/02/2020   INR 1.3 (H) 07/29/2020   HGBA1C 5.7 (H) 07/27/2020      PT/INR:  No results for input(s): LABPROT, INR in the last 72 hours. Radiology: No results found.   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 Stable cardiac rhythm and VS and he is independent with mobility. Plan discharge to home today.   -Expected acute blood loss anemia- Hct  trending up.  -Volume excess- Wt down more than UO reflects. Appears euvolemic on exam, doubt he needs further diuresis.   -Hypertension- controlled on metoprolol and irbesartan.   -Discharge to home, instructions given.  Antony Odea, PA-C [phone] 08/02/2020 8:45 AM

## 2020-08-03 ENCOUNTER — Other Ambulatory Visit: Payer: Self-pay | Admitting: *Deleted

## 2020-08-03 ENCOUNTER — Encounter: Payer: Self-pay | Admitting: Thoracic Surgery (Cardiothoracic Vascular Surgery)

## 2020-08-03 MED ORDER — METOPROLOL TARTRATE 25 MG PO TABS: 25.0000 mg | ORAL_TABLET | Freq: Two times a day (BID) | ORAL | 2 refills | Status: DC

## 2020-08-04 LAB — TYPE AND SCREEN
ABO/RH(D): B POS
Antibody Screen: NEGATIVE
Unit division: 0
Unit division: 0

## 2020-08-04 LAB — BPAM RBC
Blood Product Expiration Date: 202205112359
Blood Product Expiration Date: 202205142359
ISSUE DATE / TIME: 202204200906
ISSUE DATE / TIME: 202204200906
Unit Type and Rh: 7300
Unit Type and Rh: 7300

## 2020-08-07 ENCOUNTER — Telehealth (HOSPITAL_COMMUNITY): Payer: Self-pay

## 2020-08-07 NOTE — Telephone Encounter (Signed)
Called patient to see if he is interested in the Cardiac Rehab Program. Patient expressed interest. Explained scheduling process and went over insurance, patient verbalized understanding. Will contact patient for scheduling once f/u has been completed.  °

## 2020-08-07 NOTE — Telephone Encounter (Signed)
Pt insurance is active and benefits verified through [facility]. Co-pay $0.00, DED $4,500.00/$2,500.00 met, out of pocket $7,000.00/$7,000.00 met, co-insurance 40%. No pre-authorization required. Fredi P./UMR, 08/07/20 @ 3:15PM, JLL#[id number]  Will contact patient to see if he is interested in the Cardiac Rehab Program. If interested, patient will need to complete follow up appt. Once completed, patient will be contacted for scheduling upon review by the RN Navigator.

## 2020-08-28 ENCOUNTER — Other Ambulatory Visit: Payer: Self-pay | Admitting: Thoracic Surgery (Cardiothoracic Vascular Surgery)

## 2020-08-28 DIAGNOSIS — Z953 Presence of xenogenic heart valve: Secondary | ICD-10-CM

## 2020-08-30 NOTE — Progress Notes (Addendum)
Cardiology Office Note:    Date:  09/01/2020   ID:  TEJ MURDAUGH, DOB 07/11/1965, MRN [id number]  PCP:  Leanna Battles, [facility]  Cardiologist:  Freada Bergeron, MD  Advanced Practice Provider:  No care team member to display Electrophysiologist:  None   Referring MD: Leanna Battles, MD    History of Present Illness:    William Welch is a 55 y.o. male with a hx of HTN, HLD, GERD and recent diagnosis of severe aortic stenosis who returns to clinic for follow-up.  Was initially seen in clinic on 06/16/20 where he was referred by his PCP for worsening DOE and a loud systolic murmur noted on examination. TTE obtained which showed AVA 0.6, mean gradient 30mmHg, Vmax 4.44 m/s. Was referred for coronary angiography which revealed mild, non-obstructive CAD. He was referred to Dr. Roxy Manns and is now s/p AVR with Oletta Lamas Inspiris Resilia stented bovine pericardial tissue valve (size 80mm) with bovine pericardial patch enlargement of the aortic root (Nick's procedure) on 07/29/20. He did well post-operatively and he was discharged on 08/02/20.  Today, the patient feels well. Still has some discomfort at the sternal incision site but not overly bothersome. Has been able to walk without issues. Blood pressure has been well controlled off the diovan. No chest pain, nausea, vomiting. LE edema, orthopnea or PND. Tolerating medications without issues.    Past Medical History:  Diagnosis Date  . Arthritis   . GERD (gastroesophageal reflux disease)   . Gout   . Heart murmur   . Hyperlipidemia   . Hypertension   . S/P aortic valve replacement with bioprosthetic valve 07/29/2020   21 mm Edwards Inspiris Resilia stented bovine pericardial tissue valve with bovine pericardial patch enlargement of the aortic root  . Severe aortic stenosis     Past Surgical History:  Procedure Laterality Date  . AORTIC ROOT ENLARGEMENT  07/29/2020   Procedure: AORTIC ROOT  ENLARGEMENT USING 6CMX8CM PERI-GUARD PERICARDIAL PATCH;  Surgeon: Rexene Alberts, MD;  Location: [facility];  Service: Open Heart Surgery;;  . AORTIC VALVE REPLACEMENT N/A 07/29/2020   Procedure: AORTIC VALVE REPLACEMENT (AVR) USING 21MM INSPIRIS RESILIA  AORTIC VALVE;  Surgeon: Rexene Alberts, MD;  Location: [facility];  Service: Open Heart Surgery;  Laterality: N/A;  . FACIAL RECONSTRUCTION SURGERY    . JOINT REPLACEMENT Right    knee  . RIGHT/LEFT HEART CATH AND CORONARY ANGIOGRAPHY N/A 07/01/2020   Procedure: RIGHT/LEFT HEART CATH AND CORONARY ANGIOGRAPHY;  Surgeon: Burnell Blanks, MD;  Location: [facility];  Service: Cardiovascular;  Laterality: N/A;  . TEE WITHOUT CARDIOVERSION N/A 07/29/2020   Procedure: TRANSESOPHAGEAL ECHOCARDIOGRAM (TEE);  Surgeon: Rexene Alberts, MD;  Location: [address];  Service: Open Heart Surgery;  Laterality: N/A;    Current Medications: Current Meds  Medication Sig  . allopurinol (ZYLOPRIM) 300 MG tablet Take 300 mg by mouth daily.  Marland Kitchen aspirin EC 325 MG EC tablet Take 1 tablet (325 mg total) by mouth daily.  . colchicine 0.6 MG tablet Take 0.6 mg by mouth daily as needed (Gout).  Marland Kitchen ezetimibe (ZETIA) 10 MG tablet Take 1 tablet (10 mg total) by mouth daily.  . metoprolol tartrate (LOPRESSOR) 25 MG tablet Take 1 tablet (25 mg total) by mouth 2 (two) times daily.  Marland Kitchen omeprazole (PRILOSEC) 20 MG capsule Take 20 mg by mouth daily.  . rosuvastatin (CRESTOR) 20 MG tablet Take 20 mg by mouth daily.  Allergies:   Percocet [oxycodone-acetaminophen]   Social History   Socioeconomic History  . Marital status: Married    Spouse name: Not on file  . Number of children: Not on file  . Years of education: Not on file  . Highest education level: Not on file  Occupational History  . Not on file  Tobacco Use  . Smoking status: Never Smoker  . Smokeless tobacco: Never Used  Vaping Use  . Vaping Use: Never used  Substance and Sexual Activity  . Alcohol  use: Yes    Alcohol/week: 21.0 standard drinks    Types: 21 Cans of beer per week    Comment: 06/26/20  . Drug use: Not Currently  . Sexual activity: Not on file  Other Topics Concern  . Not on file  Social History Narrative  . Not on file   Social Determinants of Health   Financial Resource Strain: Not on file  Food Insecurity: Not on file  Transportation Needs: Not on file  Physical Activity: Not on file  Stress: Not on file  Social Connections: Not on file     Family History: The patient's family history is negative for Colon cancer, Esophageal cancer, Rectal cancer, and Stomach cancer.  ROS:   Please see the history of present illness.    Review of Systems  Constitutional: Negative for chills, diaphoresis and fever.  HENT: Negative for hearing loss.   Eyes: Negative for blurred vision and redness.  Respiratory: Negative for shortness of breath.   Cardiovascular: Negative for chest pain, palpitations, orthopnea, claudication, leg swelling and PND.  Gastrointestinal: Negative for melena, nausea and vomiting.  Genitourinary: Negative for dysuria and flank pain.  Musculoskeletal: Negative for falls.  Neurological: Negative for dizziness and loss of consciousness.  Endo/Heme/Allergies: Negative for polydipsia.  Psychiatric/Behavioral: Negative for substance abuse.    EKGs/Labs/Other Studies Reviewed:    The following studies were reviewed today: TTE 2020-07-02: IMPRESSIONS  1. The aortic valve is calcified. Aortic valve regurgitation is mild.  Severe aortic valve stenosis. Aortic valve area, by VTI measures 0.60 cm.  Aortic valve mean gradient measures 44.7 mmHg. Aortic valve Vmax measures  4.44 m/s. Through morphologically  unclear, given demographic data, diagnosis of biscuspid aortic valve  should be considered.  2. Left ventricular ejection fraction, by estimation, is 55 to 60%. The  left ventricle has normal function. The left ventricle has no regional  wall  motion abnormalities. There is mild concentric left ventricular  hypertrophy. Left ventricular diastolic  parameters are consistent with Grade I diastolic dysfunction (impaired  relaxation).  3. Right ventricular systolic function is normal. The right ventricular  size is normal.  4. Left atrial size was mildly dilated.  5. The mitral valve is normal in structure. No evidence of mitral valve  regurgitation. No evidence of mitral stenosis.  6. The inferior vena cava is normal in size with greater than 50%  respiratory variability, suggesting right atrial pressure of 3 mmHg.  LHC 07/01/20:  Ost RCA to Prox RCA lesion is 20% stenosed.  Prox RCA to Mid RCA lesion is 20% stenosed.  Mid LAD lesion is 20% stenosed.   Mild non-obstructive CAD Severe aortic stenosis (mean gradient by cath 37.5 mmHg, peak to peak gradient 54 mmHg, AVA 0.77cm2).   Recommendations: Will continue planning for AVR. He would be a candidate for surgical AVR or TAVR but given his young age, he may be best treated with conventional surgical AVR. Will make a referral to our CT surgeons  on the valve team.   ECG 09/01/20: NSR with HR 79   Recent Labs: 07/29/2020: ALT 33 08/01/2020: Magnesium 2.1 08/02/2020: BUN 12; Creatinine, Ser 1.12; Hemoglobin 9.0; Platelets 124; Potassium 3.9; Sodium 138  Recent Lipid Panel No results found for: CHOL, TRIG, HDL, CHOLHDL, VLDL, LDLCALC, LDLDIRECT   Risk Assessment/Calculations:       Physical Exam:    VS:  BP 116/76   Pulse 79   Ht 5\' 6"  (1.676 m)   Wt 143 lb (64.9 kg)   SpO2 96%   BMI 23.08 kg/m     Wt Readings from Last 3 Encounters:  09/01/20 143 lb (64.9 kg)  08/31/20 139 lb (63 kg)  08/02/20 143 lb 6.4 oz (65 kg)     GEN:  Well nourished, well developed in no acute distress HEENT: Normal NECK: No JVD; No carotid bruits CARDIAC: RRR, 1/6 systolic murmur best heard at RUSB RESPIRATORY:  Clear to auscultation without rales, wheezing or rhonchi  ABDOMEN:  Soft, non-tender, non-distended MUSCULOSKELETAL:  No edema; No deformity  SKIN: Warm and dry NEUROLOGIC:  Alert and oriented x 3 PSYCHIATRIC:  Normal affect   ASSESSMENT:    1. Severe aortic stenosis   2. Hyperlipidemia, unspecified hyperlipidemia type   3. S/P aortic valve replacement with bioprosthetic valve   4. Hypertension, unspecified type    PLAN:    In order of problems listed above:  #Severe Aortic Stenosis s/p AVR: Pre-op TTE 06/2020 with AVA 0.6, mean gradient 59mmHg, Vmax 4.91m/s. Cath without obstructive disease. Now s/p AVR with Edwards Inspiris Resilia stented bovine pericardial tissue valve (size 40mm) with bovine pericardial patch enlargement of the aortic root (Nick's procedure) on 07/29/20 with Dr. Roxy Manns. Doing very well with no anginal or HF symptoms.  -Continue ASA 81mg  daily -Follow-up with CV surgery as scheduled -Post-op TTE scheduled  #HTN: ECG with LVH and strain. Now significantly improved s/p AVR. -Off Diovan with blood pressure well controlled -Continue metop 25mg  BID  #HLD: -Continue zetia 10mg  daily -Continue crestor 20mg  daily -Repeat lipids next week with goal <100   Medication Adjustments/Labs and Tests Ordered: Current medicines are reviewed at length with the patient today.  Concerns regarding medicines are outlined above.  Orders Placed This Encounter  Procedures  . Lipid Profile  . EKG 12-Lead   No orders of the defined types were placed in this encounter.   Patient Instructions  Medication Instructions:   Your physician recommends that you continue on your current medications as directed. Please refer to the Current Medication list given to you today.  *If you need a refill on your cardiac medications before your next appointment, please call your pharmacy*   Lab Work:  ON 09/10/20-SAME DAY AS YOU COME FOR YOUR SCHEDULED ECHO--LIPIDS--PLEASE COME FASTING TO THIS LAB APPOINTMENT  If you have labs (blood work) drawn today and  your tests are completely normal, you will receive your results only by: Marland Kitchen MyChart Message (if you have MyChart) OR . A paper copy in the mail If you have any lab test that is abnormal or we need to change your treatment, we will call you to review the results.   Follow-Up: At [facility], you and your health needs are our priority.  As part of our continuing mission to provide you with exceptional heart care, we have created designated Provider Care Teams.  These Care Teams include your primary Cardiologist (physician) and Advanced Practice Providers (APPs -  Physician Assistants and Nurse Practitioners) who all work  together to provide you with the care you need, when you need it.  We recommend signing up for the patient portal called "MyChart".  Sign up information is provided on this After Visit Summary.  MyChart is used to connect with patients for Virtual Visits (Telemedicine).  Patients are able to view lab/test results, encounter notes, upcoming appointments, etc.  Non-urgent messages can be sent to your provider as well.   To learn more about what you can do with MyChart, go to [email or web address].    Your next appointment:   8 month(s)  The format for your next appointment:   In Person  Provider:   Gwyndolyn Kaufman, MD   [place] DR. Johney Frame     Signed, Freada Bergeron, MD  09/01/2020 3:25 PM    [place]

## 2020-08-31 ENCOUNTER — Ambulatory Visit
Admission: RE | Admit: 2020-08-31 | Discharge: 2020-08-31 | Disposition: A | Discharge status: Home / Self Care | Payer: Commercial Managed Care - PPO | Source: Ambulatory Visit | Attending: Thoracic Surgery (Cardiothoracic Vascular Surgery) | Admitting: Thoracic Surgery (Cardiothoracic Vascular Surgery)

## 2020-08-31 ENCOUNTER — Other Ambulatory Visit: Payer: Self-pay

## 2020-08-31 ENCOUNTER — Ambulatory Visit (INDEPENDENT_AMBULATORY_CARE_PROVIDER_SITE_OTHER): Payer: Self-pay | Admitting: Physician Assistant

## 2020-08-31 VITALS — BP 117/82 | HR 88 | Resp 20 | Ht 66.0 in | Wt 139.0 lb

## 2020-08-31 DIAGNOSIS — Z953 Presence of xenogenic heart valve: Secondary | ICD-10-CM

## 2020-08-31 NOTE — Patient Instructions (Signed)
Endocarditis is a potentially serious infection of heart valves or inside lining of the heart.  It occurs more commonly in patients with diseased heart valves (such as patient's with aortic or mitral valve disease) and in patients who have undergone heart valve repair or replacement.  Certain surgical and dental procedures may put you at risk, such as dental cleaning, other dental procedures, or any surgery involving the respiratory, urinary, gastrointestinal tract, gallbladder or prostate gland.   To minimize your chances for develooping endocarditis, maintain good oral health and seek prompt medical attention for any infections involving the mouth, teeth, gums, skin or urinary tract.    Always notify your doctor or dentist about your underlying heart valve condition before having any invasive procedures. You will need to take antibiotics before certain procedures, including all routine dental cleanings or other dental procedures.  Your cardiologist or dentist should prescribe these antibiotics for you to be taken ahead of time.  You may return to driving an automobile as long as you are no longer requiring oral narcotic pain relievers during the daytime.  It would be wise to start driving only short distances during the daylight and gradually increase from there as you feel comfortable.  Make every effort to maintain a "heart-healthy" lifestyle with regular physical exercise and adherence to a low-fat, low-carbohydrate diet.  Continue to seek regular follow-up appointments with your primary care physician and/or cardiologist.  You may continue to gradually increase your physical activity as tolerated.  Refrain from any heavy lifting or strenuous use of your arms and shoulders until at least 8 weeks from the time of your surgery, and avoid activities that cause increased pain in your chest on the side of your surgical incision.  Otherwise you may continue to increase activities without any particular  limitations.  Increase the intensity and duration of physical activity gradually.          

## 2020-08-31 NOTE — Progress Notes (Signed)
HPI: Patient returns for routine postoperative follow-up having undergone AVR on 07/29/2020. The patient's early postoperative recovery while in the hospital was without complication. Since hospital discharge the patient reports he is overall doing well.  He continues to have some discomfort/tightness along his neck/upper incision area.  He is ambulating without difficulty about 1 mile per day.  He states his left chest tube site was initially draining, but is not a thick scab.  He denies lower extremity edema.  His appetite has recovered  He also questions if he needs to continue to take Valsartan.  He does question how long before he can return to work.  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   Current Outpatient Medications  Medication Sig Dispense Refill  . allopurinol (ZYLOPRIM) 300 MG tablet Take 300 mg by mouth daily.    Marland Kitchen aspirin EC 325 MG EC tablet Take 1 tablet (325 mg total) by mouth daily. 30 tablet 0  . colchicine 0.6 MG tablet Take 0.6 mg by mouth daily as needed (Gout).    Marland Kitchen ezetimibe (ZETIA) 10 MG tablet Take 1 tablet (10 mg total) by mouth daily. 90 tablet 3  . metoprolol tartrate (LOPRESSOR) 25 MG tablet Take 1 tablet (25 mg total) by mouth 2 (two) times daily. 60 tablet 2  . omeprazole (PRILOSEC) 20 MG capsule Take 20 mg by mouth daily.    . rosuvastatin (CRESTOR) 20 MG tablet Take 20 mg by mouth daily.    . traMADol (ULTRAM) 50 MG tablet Take 1 tablet (50 mg total) by mouth every 6 (six) hours as needed for moderate pain. 20 tablet 0  . valsartan (DIOVAN) 40 MG tablet Take 1 tablet (40 mg total) by mouth daily. 90 tablet 3   No current facility-administered medications for this visit.    Physical Exam:  BP 117/82 (BP Location: Left Arm, Patient Position: Sitting)   Pulse 88   Resp 20   Ht 5\' 6"  (1.676 m)   Wt 139 lb (63 kg)   SpO2 100% Comment: RA  BMI 22.44 kg/m   Gen: no apparent distress  Heart: RRR Lungs: CTA  bilaterally Ext: no edema present Incisions: sternotomy well healed, left chest tube site with thickened black eschar no evidenced of infection  Diagnostic Tests:  CXR: left basilar atelectasis, no pleural effusion present, no pneumothorax  A/P:  1. S/P AVR- doing well, in NSR.. BP in office today was well controlled.  They did not take Valsartan today stating they were told to take it on days where systolic number was over 725.  I think his pressure is well controlled and he can likely discontinue Valsartan.  He has an appointment with the Cardiologist tomorrow and I will defer to them 2. RTW- patient with healing sternotomy.  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  He will require 12 weeks total of work.  This will allow him to return after October 20, 2020 3. Cardiac rehab- okay to start if patient wishes to proceed 4. Endocarditis education provided 5. Dispo- patient doing very well from AVR.  He is progressing as expected.  He is in NSR on auscultation.  He will continue to increase ambulation as tolerated.  He will get a post operative Echocardiogram in a few weeks.  We will see him back in our office in 3 months or sooner if need arises.  Ellwood Handler, PA-C [facility] [phone]

## 2020-09-01 ENCOUNTER — Encounter: Payer: Self-pay | Admitting: Cardiology

## 2020-09-01 ENCOUNTER — Telehealth: Payer: Self-pay | Admitting: Cardiology

## 2020-09-01 ENCOUNTER — Ambulatory Visit: Payer: Commercial Managed Care - PPO | Admitting: Cardiology

## 2020-09-01 VITALS — BP 116/76 | HR 79 | Ht 66.0 in | Wt 143.0 lb

## 2020-09-01 DIAGNOSIS — I350 Nonrheumatic aortic (valve) stenosis: Secondary | ICD-10-CM

## 2020-09-01 DIAGNOSIS — E785 Hyperlipidemia, unspecified: Secondary | ICD-10-CM | POA: Diagnosis not present

## 2020-09-01 DIAGNOSIS — Z953 Presence of xenogenic heart valve: Secondary | ICD-10-CM

## 2020-09-01 DIAGNOSIS — I10 Essential (primary) hypertension: Secondary | ICD-10-CM | POA: Diagnosis not present

## 2020-09-01 NOTE — Telephone Encounter (Signed)
Called and spoke to patient to inform him of the Physician's statement STD form we received. Informed him of the $29 fee and authorization needed to complete the form. AO 09/01/20

## 2020-09-01 NOTE — Patient Instructions (Signed)
Medication Instructions:   Your physician recommends that you continue on your current medications as directed. Please refer to the Current Medication list given to you today.  *If you need a refill on your cardiac medications before your next appointment, please call your pharmacy*   Lab Work:  ON 09/10/20-SAME DAY AS YOU COME FOR YOUR SCHEDULED ECHO--LIPIDS--PLEASE COME FASTING TO THIS LAB APPOINTMENT  If you have labs (blood work) drawn today and your tests are completely normal, you will receive your results only by: Marland Kitchen MyChart Message (if you have MyChart) OR . A paper copy in the mail If you have any lab test that is abnormal or we need to change your treatment, we will call you to review the results.   Follow-Up: At [facility], you and your health needs are our priority.  As part of our continuing mission to provide you with exceptional heart care, we have created designated Provider Care Teams.  These Care Teams include your primary Cardiologist (physician) and Advanced Practice Providers (APPs -  Physician Assistants and Nurse Practitioners) who all work together to provide you with the care you need, when you need it.  We recommend signing up for the patient portal called "MyChart".  Sign up information is provided on this After Visit Summary.  MyChart is used to connect with patients for Virtual Visits (Telemedicine).  Patients are able to view lab/test results, encounter notes, upcoming appointments, etc.  Non-urgent messages can be sent to your provider as well.   To learn more about what you can do with MyChart, go to [email or web address].    Your next appointment:   8 month(s)  The format for your next appointment:   In Person  Provider:   Gwyndolyn Kaufman, MD   SCHEDULING YOU MAY CANCEL THE PATIENTS AUGUST APPOINTMENT WITH DR. Johney Frame

## 2020-09-02 ENCOUNTER — Telehealth: Payer: Self-pay

## 2020-09-02 NOTE — Telephone Encounter (Signed)
Attending Physician's Statement completed and faxed to [place], # [phone]. Beginning leave 07/29/20 through 10/26/20

## 2020-09-10 ENCOUNTER — Other Ambulatory Visit: Payer: Commercial Managed Care - PPO | Admitting: *Deleted

## 2020-09-10 ENCOUNTER — Other Ambulatory Visit: Payer: Self-pay

## 2020-09-10 ENCOUNTER — Ambulatory Visit (HOSPITAL_COMMUNITY): Payer: Commercial Managed Care - PPO | Attending: Cardiology

## 2020-09-10 DIAGNOSIS — Z0279 Encounter for issue of other medical certificate: Secondary | ICD-10-CM

## 2020-09-10 DIAGNOSIS — Z953 Presence of xenogenic heart valve: Secondary | ICD-10-CM

## 2020-09-10 DIAGNOSIS — I350 Nonrheumatic aortic (valve) stenosis: Secondary | ICD-10-CM | POA: Diagnosis present

## 2020-09-10 DIAGNOSIS — E785 Hyperlipidemia, unspecified: Secondary | ICD-10-CM

## 2020-09-10 LAB — ECHOCARDIOGRAM COMPLETE
AR max vel: 1.13 cm2
AV Area VTI: 1.34 cm2
AV Area mean vel: 1.2 cm2
AV Mean grad: 8 mmHg
AV Peak grad: 17.3 mmHg
Ao pk vel: 2.08 m/s
Area-P 1/2: 6.77 cm2
S' Lateral: 2.5 cm

## 2020-09-10 LAB — LIPID PANEL
Chol/HDL Ratio: 3.4 ratio (ref 0.0–5.0)
Cholesterol, Total: 144 mg/dL (ref 100–199)
HDL: 42 mg/dL (ref >39)
LDL Chol Calc (NIH): 88 mg/dL (ref 0–99)
Triglycerides: 68 mg/dL (ref 0–149)
VLDL Cholesterol Cal: 14 mg/dL (ref 5–40)

## 2020-09-10 NOTE — Telephone Encounter (Signed)
Patient came in to pay fee and sign authorization. HIM received the form. Form has been placed in Dr. Jacolyn Reedy box to be completed. AO 09/10/20

## 2020-09-11 NOTE — Telephone Encounter (Signed)
HIM received form back from doctor. Form has been faxed to [place] as requested.  AO 09/11/20

## 2020-09-30 ENCOUNTER — Telehealth (HOSPITAL_COMMUNITY): Payer: Self-pay

## 2020-09-30 ENCOUNTER — Encounter (HOSPITAL_COMMUNITY): Payer: Self-pay

## 2020-09-30 NOTE — Telephone Encounter (Signed)
Attempted to call patient in regards to Cardiac Rehab - LM on VM Mailed letter 

## 2020-10-20 NOTE — Telephone Encounter (Signed)
No repsonse from pt.  Closed referral  

## 2020-11-10 ENCOUNTER — Ambulatory Visit: Payer: Commercial Managed Care - PPO | Admitting: Cardiology

## 2020-11-12 ENCOUNTER — Other Ambulatory Visit: Payer: Self-pay | Admitting: Physician Assistant

## 2020-11-17 ENCOUNTER — Other Ambulatory Visit: Payer: Self-pay | Admitting: Physician Assistant

## 2020-11-20 ENCOUNTER — Telehealth: Payer: Self-pay | Admitting: Cardiology

## 2020-11-20 ENCOUNTER — Other Ambulatory Visit: Payer: Self-pay | Admitting: *Deleted

## 2020-11-20 MED ORDER — METOPROLOL TARTRATE 25 MG PO TABS: 25.0000 mg | ORAL_TABLET | Freq: Two times a day (BID) | ORAL | 3 refills | Status: DC

## 2020-11-20 NOTE — Telephone Encounter (Signed)
  *  STAT* If patient is at the pharmacy, call can be transferred to refill team.   1. Which medications need to be refilled? (please list name of each medication and dose if known) metoprolol tartrate (LOPRESSOR) 25 MG tablet  2. Which pharmacy/location (including street and city if local pharmacy) is medication to be sent to? [place], [place]  3. Do they need a 30 day or 90 day supply? 90 days  Pt is out of meds for several days now and needs refill today

## 2020-11-23 NOTE — Telephone Encounter (Signed)
Refill was sent in 11/20/2020, will close this encounter

## 2020-11-30 ENCOUNTER — Encounter: Payer: Self-pay | Admitting: Physician Assistant

## 2020-11-30 ENCOUNTER — Other Ambulatory Visit: Payer: Self-pay

## 2020-11-30 ENCOUNTER — Ambulatory Visit: Payer: Commercial Managed Care - PPO | Admitting: Physician Assistant

## 2020-11-30 VITALS — BP 146/95 | HR 93 | Resp 20 | Ht 66.0 in | Wt 151.8 lb

## 2020-11-30 DIAGNOSIS — Z953 Presence of xenogenic heart valve: Secondary | ICD-10-CM | POA: Diagnosis not present

## 2020-11-30 NOTE — Progress Notes (Signed)
[address]       [place] [address]             [phone]     William Welch is a 55 y.o. male patient status post aortic valve replacement on 07/29/2020.  The patient's postoperative recovery was uncomplicated in the hospital.  He was seen in our office on 08/31/2020.  At that time he was ambulating about a mile per day without any difficulty.  He denied any lower extremity edema.  He saw cardiology the next day without any significant concerns.  Today he presents to the office for his 18-monthfollow-up visit since surgery.  He has been doing really well since he last saw [place].  He has returned to work and has not had any pain with activity.  He does occasionally still get short of breath but this has not significantly impacted his life in any way.   1. S/P aortic valve replacement with bioprosthetic valve    Past Medical History:  Diagnosis Date   Arthritis    GERD (gastroesophageal reflux disease)    Gout    Heart murmur    Hyperlipidemia    Hypertension    S/P aortic valve replacement with bioprosthetic valve 07/29/2020   21 mm Edwards Inspiris Resilia stented bovine pericardial tissue valve with bovine pericardial patch enlargement of the aortic root   Severe aortic stenosis    No past surgical history pertinent negatives on file. Scheduled Meds: Current Outpatient Medications on File Prior to Visit  Medication Sig Dispense Refill   allopurinol (ZYLOPRIM) 300 MG tablet Take 300 mg by mouth daily.     aspirin EC 325 MG EC tablet Take 1 tablet (325 mg total) by mouth daily. 30 tablet 0   colchicine 0.6 MG tablet Take 0.6 mg by mouth daily as needed (Gout).     metoprolol tartrate (LOPRESSOR) 25 MG tablet Take 1 tablet (25 mg total) by mouth 2 (two) times daily. 60 tablet 3   omeprazole (PRILOSEC) 20 MG capsule Take 20 mg by mouth daily.     rosuvastatin (CRESTOR) 20 MG tablet Take 20 mg by mouth daily.     ezetimibe (ZETIA) 10 MG tablet Take 1  tablet (10 mg total) by mouth daily. 90 tablet 3   No current facility-administered medications on file prior to visit.     Allergies  Allergen Reactions   Percocet [Oxycodone-Acetaminophen] Hives   Active Problems:   * No active hospital problems. *  Blood pressure (!) 146/95, pulse 93, resp. rate 20, height '5\' 6"'$  (1.676 m), weight 151 lb 12.8 oz (68.9 kg), SpO2 94 %.  Cor: 2 out of 6 systolic murmur heard best at the right sternal border, regular rate and rhythm Pulm: Clear to auscultation bilaterally and in all fields Abd: No tenderness Wound: Well-healed sternotomy incision Ext: No bilateral lower extremity edema  ECHOCARDIOGRAM REPORT         Patient Name:   William SellDate of Exam: 09/10/2020  Medical Rec #:  [id number]     Height:       66.0 in  Accession #:    [id number]    Weight:       143.0 lb  Date of Birth:  1October 19, 1967      BSA:          1.734 m  Patient Age:    89years  BP:           130/99 mmHg  Patient Gender: M              HR:           102 bpm.  Exam Location:  [facility]   Procedure: 2D Echo, Cardiac Doppler, Color Doppler and Strain Analysis   Indications:    I35.9 Aortic valve disorder     History:        Patient has prior history of Echocardiogram examinations,  most                  recent 08/31/2020. Signs/Symptoms:Murmur and Dyspnea; Risk                  Factors:Hypertension and HLD.                     Aortic Valve: 21 mm Edwards Inspiris Resilia stented  bovine                  pericardial tissue valve with bovine pericardial patch                  enlargement of the aortic root valve is present in the  aortic                  position. Procedure Date: 07/29/20.     Sonographer:    Marygrace Drought RCS  Referring Phys: [id number] [place]     1. There is a 21 mm Edwards Inspiris Resilia stented bovine pericardial  tissue valve with bovine pericardial patch enlargement of the aortic root  valve present  in the aortic position. Procedure Date: 07/29/20.   2. Left ventricular ejection fraction, by estimation, is 60 to 65%. The  left ventricle has normal function. The left ventricle has no regional  wall motion abnormalities. Left ventricular diastolic parameters are  indeterminate.   3. The mitral valve is normal in structure. Trivial mitral valve  regurgitation. No evidence of mitral stenosis.   4. Aortic root/ascending aorta has been repaired/replaced.   5. The inferior vena cava is normal in size with greater than 50%  respiratory variability, suggesting right atrial pressure of 3 mmHg.   6. Right ventricular systolic function is normal. The right ventricular  size is normal. Tricuspid regurgitation signal is inadequate for assessing  PA pressure.   Comparison(s): Changes from prior study are noted. S/P AVR and aortic root  patch.   Conclusion(s)/Recommendation(s): Otherwise normal echocardiogram, with  minor abnormalities described in the report.   FINDINGS   Left Ventricle: Left ventricular ejection fraction, by estimation, is 60  to 65%. The left ventricle has normal function. The left ventricle has no  regional wall motion abnormalities. Global longitudinal strain performed  but not reported based on  interpreter judgement due to suboptimal tracking. The left ventricular  internal cavity size was normal in size. There is borderline left  ventricular hypertrophy. Abnormal (paradoxical) septal motion consistent  with post-operative status. Left ventricular   diastolic parameters are indeterminate.   Right Ventricle: The right ventricular size is normal. Right vetricular  wall thickness was not well visualized. Right ventricular systolic  function is normal. Tricuspid regurgitation signal is inadequate for  assessing PA pressure.   Left Atrium: Left atrial size was normal in size.   Right Atrium: Right atrial size was normal in size.   Pericardium: There is no evidence of  pericardial  effusion.   Mitral Valve: The mitral valve is normal in structure. Trivial mitral  valve regurgitation. No evidence of mitral valve stenosis.   Tricuspid Valve: The tricuspid valve is normal in structure. Tricuspid  valve regurgitation is trivial. No evidence of tricuspid stenosis.   Aortic Valve: The aortic valve has been repaired/replaced. Aortic valve  regurgitation is not visualized. Aortic valve mean gradient measures 8.0  mmHg. Aortic valve peak gradient measures 17.3 mmHg. Aortic valve area, by  VTI measures 1.34 cm. There is a  21 mm Edwards Inspiris Resilia stented bovine pericardial tissue valve  with bovine pericardial patch enlargement of the aortic root valve present  in the aortic position. Procedure Date: 07/29/20.   Pulmonic Valve: The pulmonic valve was not well visualized. Pulmonic valve  regurgitation is not visualized.   Aorta: S/P aortic root patch. The aortic root/ascending aorta has been  repaired/replaced.   Venous: The inferior vena cava is normal in size with greater than 50%  respiratory variability, suggesting right atrial pressure of 3 mmHg.   IAS/Shunts: The atrial septum is grossly normal.      LEFT VENTRICLE  PLAX 2D  LVIDd:         3.70 cm  Diastology  LVIDs:         2.50 cm  LV e' medial:    6.74 cm/s  LV PW:         1.10 cm  LV E/e' medial:  18.5  LV IVS:        1.10 cm  LV e' lateral:   10.80 cm/s  LVOT diam:     2.00 cm  LV E/e' lateral: 11.6  LV SV:         45  LV SV Index:   26  LVOT Area:     3.14 cm      RIGHT VENTRICLE  RV Basal diam:  2.90 cm  RV S prime:     7.18 cm/s  TAPSE (M-mode): 1.2 cm   LEFT ATRIUM             Index       RIGHT ATRIUM           Index  LA diam:        3.80 cm 2.19 cm/m  RA Area:     10.70 cm  LA Vol (A2C):   39.8 ml 22.95 ml/m RA Volume:   23.30 ml  13.44 ml/m  LA Vol (A4C):   18.5 ml 10.67 ml/m  LA Biplane Vol: 28.4 ml 16.38 ml/m   AORTIC VALVE  AV Area (Vmax):    1.13 cm  AV  Area (Vmean):   1.20 cm  AV Area (VTI):     1.34 cm  AV Vmax:           208.00 cm/s  AV Vmean:          132.000 cm/s  AV VTI:            0.338 m  AV Peak Grad:      17.3 mmHg  AV Mean Grad:      8.0 mmHg  LVOT Vmax:         75.00 cm/s  LVOT Vmean:        50.500 cm/s  LVOT VTI:          0.144 m  LVOT/AV VTI ratio: 0.43     AORTA  Ao Root diam: 3.00 cm  Ao Asc diam:  3.30 cm  MITRAL VALVE  MV Area (PHT):              SHUNTS  MV Decel Time:              Systemic VTI:  0.14 m  MV E velocity: 125.00 cm/s  Systemic Diam: 2.00 cm  MV A velocity: 127.00 cm/s  MV E/A ratio:  0.98   Buford Dresser MD  Electronically signed by Buford Dresser MD  Signature Date/Time: 09/10/2020/1:48:38 PM       Final    Subjective Objective: Vital signs (most recent): Blood pressure (!) 146/95, pulse 93, resp. rate 20, height '5\' 6"'$  (1.676 m), weight 151 lb 12.8 oz (68.9 kg), SpO2 94 %. Assessment & Plan  Severe aortic stenosis status post AVR-recent echocardiogram shows normal aortic valve function.  Patient is asymptomatic at this time other than occasional shortness of breath which he notices especially when working outside in the heat. Hypertension-blood pressure was slightly elevated today in the clinic however, patient has not been taking it at home.  I encouraged him to take his blood pressure at home over the next week at least 2 times a day and call our office if it remains elevated.  We can always start him back on his valsartan and titrate if needed. Hyperlipidemia-continue Zetia 10 mg daily and Crestor 20 mg daily.  He is following up with cardiology in a few months to monitor his lipids Echocardiogram-his estimated left ejection fraction is 60 to 65%.  He has normal valvular function.  His bovine pericardial tissue aortic valve does not have any perivalvular leak present and is functioning well.  Full report listed above.  Plan: Overall, the patient is doing very well 3 months  after aortic valve replacement.  He is not limited in any of his activities and has gone back to work without difficulty.  His incisions are all healing well and his sternum is stable to palpation.  I do not think he needs to come back to see [place] unless he has issues or concerns.  I did ask him to call our office if his blood pressure remains elevated at home so that we may restart his valsartan.  Otherwise, he is cleared from our services and can follow-up with cardiology.  Elgie Collard 11/30/2020

## 2021-05-08 NOTE — Progress Notes (Deleted)
Cardiology Office Note:    Date:  05/08/2021   ID:  William Welch, DOB 04/01/1966, MRN [id number]  PCP:  Donnajean Lopes, MD   [place]  Cardiologist:  Freada Bergeron, MD  Advanced Practice Provider:  No care team member to display Electrophysiologist:  None   Referring MD: Donnajean Lopes, MD    History of Present Illness:    William Welch is a 56 y.o. male with a hx of HTN, HLD, GERD and recent diagnosis of severe aortic stenosis who returns to clinic for follow-up.  Was initially seen in clinic on 06/16/20 where he was referred by his PCP for worsening DOE and a loud systolic murmur noted on examination. TTE obtained which showed AVA 0.6, mean gradient 48mmHg, Vmax 4.44 m/s. Was referred for coronary angiography which revealed mild, non-obstructive CAD. He was referred to Dr. Roxy Manns and is now s/p AVR with Oletta Lamas Inspiris Resilia stented bovine pericardial tissue valve (size 21 mm) with bovine pericardial patch enlargement of the aortic root (Nick's procedure) on 07/29/20. He did well post-operatively and he was discharged on 08/02/20.  Was last seen in clinic on 08/2020 where he was doing well post-operatively. TTE 09/2020 with EF 60-65%, normal functioning AVR.  Today, ***   Past Medical History:  Diagnosis Date   Arthritis    GERD (gastroesophageal reflux disease)    Gout    Heart murmur    Hyperlipidemia    Hypertension    S/P aortic valve replacement with bioprosthetic valve 07/29/2020   21 mm Edwards Inspiris Resilia stented bovine pericardial tissue valve with bovine pericardial patch enlargement of the aortic root   Severe aortic stenosis     Past Surgical History:  Procedure Laterality Date   AORTIC ROOT ENLARGEMENT  07/29/2020   Procedure: AORTIC ROOT ENLARGEMENT USING 6CMX8CM PERI-GUARD PERICARDIAL PATCH;  Surgeon: Rexene Alberts, MD;  Location: [facility];  Service: Open Heart Surgery;;   AORTIC VALVE REPLACEMENT N/A 07/29/2020    Procedure: AORTIC VALVE REPLACEMENT (AVR) USING 21MM INSPIRIS RESILIA  AORTIC VALVE;  Surgeon: Rexene Alberts, MD;  Location: [facility];  Service: Open Heart Surgery;  Laterality: N/A;   FACIAL RECONSTRUCTION SURGERY     JOINT REPLACEMENT Right    knee   RIGHT/LEFT HEART CATH AND CORONARY ANGIOGRAPHY N/A 07/01/2020   Procedure: RIGHT/LEFT HEART CATH AND CORONARY ANGIOGRAPHY;  Surgeon: Burnell Blanks, MD;  Location: [facility];  Service: Cardiovascular;  Laterality: N/A;   TEE WITHOUT CARDIOVERSION N/A 07/29/2020   Procedure: TRANSESOPHAGEAL ECHOCARDIOGRAM (TEE);  Surgeon: Rexene Alberts, MD;  Location: [facility];  Service: Open Heart Surgery;  Laterality: N/A;    Current Medications: No outpatient medications have been marked as taking for the 05/12/21 encounter (Appointment) with Freada Bergeron, MD.     Allergies:   Percocet [oxycodone-acetaminophen]   Social History   Socioeconomic History   Marital status: Married    Spouse name: Not on file   Number of children: Not on file   Years of education: Not on file   Highest education level: Not on file  Occupational History   Not on file  Tobacco Use   Smoking status: Never   Smokeless tobacco: Never  Vaping Use   Vaping Use: Never used  Substance and Sexual Activity   Alcohol use: Yes    Alcohol/week: 21.0 standard drinks    Types: 21 Cans of beer per week    Comment: 06/26/20  Drug use: Not Currently   Sexual activity: Not on file  Other Topics Concern   Not on file  Social History Narrative   Not on file   Social Determinants of Health   Financial Resource Strain: Not on file  Food Insecurity: Not on file  Transportation Needs: Not on file  Physical Activity: Not on file  Stress: Not on file  Social Connections: Not on file     Family History: The patient's family history is negative for Colon cancer, Esophageal cancer, Rectal cancer, and Stomach cancer.  ROS:   Please see the history of  present illness.    Review of Systems  Constitutional:  Negative for chills, diaphoresis and fever.  HENT:  Negative for hearing loss.   Eyes:  Negative for blurred vision and redness.  Respiratory:  Negative for shortness of breath.   Cardiovascular:  Negative for chest pain, palpitations, orthopnea, claudication, leg swelling and PND.  Gastrointestinal:  Negative for melena, nausea and vomiting.  Genitourinary:  Negative for dysuria and flank pain.  Musculoskeletal:  Negative for falls.  Neurological:  Negative for dizziness and loss of consciousness.  Endo/Heme/Allergies:  Negative for polydipsia.  Psychiatric/Behavioral:  Negative for substance abuse.    EKGs/Labs/Other Studies Reviewed:    The following studies were reviewed today: TTE 10-03-20: IMPRESSIONS     1. There is a 21 mm Edwards Inspiris Resilia stented bovine pericardial  tissue valve with bovine pericardial patch enlargement of the aortic root  valve present in the aortic position. Procedure Date: 07/29/20.   2. Left ventricular ejection fraction, by estimation, is 60 to 65%. The  left ventricle has normal function. The left ventricle has no regional  wall motion abnormalities. Left ventricular diastolic parameters are  indeterminate.   3. The mitral valve is normal in structure. Trivial mitral valve  regurgitation. No evidence of mitral stenosis.   4. Aortic root/ascending aorta has been repaired/replaced.   5. The inferior vena cava is normal in size with greater than 50%  respiratory variability, suggesting right atrial pressure of 3 mmHg.   6. Right ventricular systolic function is normal. The right ventricular  size is normal. Tricuspid regurgitation signal is inadequate for assessing  PA pressure.   TTE 06/19/20: IMPRESSIONS   1. The aortic valve is calcified. Aortic valve regurgitation is mild.  Severe aortic valve stenosis. Aortic valve area, by VTI measures 0.60 cm.  Aortic valve mean gradient  measures 44.7 mmHg. Aortic valve Vmax measures  4.44 m/s. Through morphologically  unclear, given demographic data, diagnosis of biscuspid aortic valve  should be considered.   2. Left ventricular ejection fraction, by estimation, is 55 to 60%. The  left ventricle has normal function. The left ventricle has no regional  wall motion abnormalities. There is mild concentric left ventricular  hypertrophy. Left ventricular diastolic  parameters are consistent with Grade I diastolic dysfunction (impaired  relaxation).   3. Right ventricular systolic function is normal. The right ventricular  size is normal.   4. Left atrial size was mildly dilated.   5. The mitral valve is normal in structure. No evidence of mitral valve  regurgitation. No evidence of mitral stenosis.   6. The inferior vena cava is normal in size with greater than 50%  respiratory variability, suggesting right atrial pressure of 3 mmHg.  LHC 07/01/20: Ost RCA to Prox RCA lesion is 20% stenosed. Prox RCA to Mid RCA lesion is 20% stenosed. Mid LAD lesion is 20% stenosed.   Mild non-obstructive  CAD Severe aortic stenosis (mean gradient by cath 37.5 mmHg, peak to peak gradient 54 mmHg, AVA 0.77cm2).    Recommendations: Will continue planning for AVR. He would be a candidate for surgical AVR or TAVR but given his young age, he may be best treated with conventional surgical AVR. Will make a referral to our CT surgeons on the valve team.   ECG 09/01/20: NSR with HR 79   Recent Labs: 07/29/2020: ALT 33 08/01/2020: Magnesium 2.1 08/02/2020: BUN 12; Creatinine, Ser 1.12; Hemoglobin 9.0; Platelets 124; Potassium 3.9; Sodium 138  Recent Lipid Panel    Component Value Date/Time   CHOL 144 09/10/2020 1007   TRIG 68 09/10/2020 1007   HDL 42 09/10/2020 1007   CHOLHDL 3.4 09/10/2020 1007   LDLCALC 88 09/10/2020 1007     Risk Assessment/Calculations:       Physical Exam:    VS:  There were no vitals taken for this visit.     Wt Readings from Last 3 Encounters:  11/30/20 151 lb 12.8 oz (68.9 kg)  09/01/20 143 lb (64.9 kg)  08/31/20 139 lb (63 kg)     GEN:  Well nourished, well developed in no acute distress HEENT: Normal NECK: No JVD; No carotid bruits CARDIAC: RRR, 1/6 systolic murmur best heard at RUSB RESPIRATORY:  Clear to auscultation without rales, wheezing or rhonchi  ABDOMEN: Soft, non-tender, non-distended MUSCULOSKELETAL:  No edema; No deformity  SKIN: Warm and dry NEUROLOGIC:  Alert and oriented x 3 PSYCHIATRIC:  Normal affect   ASSESSMENT:    No diagnosis found.  PLAN:    In order of problems listed above:  #Severe Aortic Stenosis s/p AVR: Pre-op TTE 06/2020 with AVA 0.6, mean gradient 6mmHg, Vmax 4.27m/s. Cath without obstructive disease. Now s/p AVR with Edwards Inspiris Resilia stented bovine pericardial tissue valve (size 21 mm) with bovine pericardial patch enlargement of the aortic root (Nick's procedure) on 07/29/20 with Dr. Roxy Manns. Doing very well with no anginal or HF symptoms.  -Continue ASA 81mg  daily -Follow-up with CV surgery as scheduled  #HTN: Now significantly improved s/p AVR. -Off Diovan with blood pressure well controlled -Continue metop 25mg  BID   #HLD: -Continue zetia 10mg  daily -Continue crestor 20mg  daily   Medication Adjustments/Labs and Tests Ordered: Current medicines are reviewed at length with the patient today.  Concerns regarding medicines are outlined above.  No orders of the defined types were placed in this encounter.  No orders of the defined types were placed in this encounter.   There are no Patient Instructions on file for this visit.    Signed, Freada Bergeron, MD  05/08/2021 9:09 PM    [place]

## 2021-05-12 ENCOUNTER — Encounter: Payer: Self-pay | Admitting: Cardiology

## 2021-05-12 ENCOUNTER — Ambulatory Visit: Payer: Commercial Managed Care - PPO | Admitting: Cardiology

## 2021-05-12 ENCOUNTER — Other Ambulatory Visit: Payer: Self-pay

## 2021-05-12 VITALS — BP 112/70 | HR 92 | Ht 66.0 in | Wt 154.0 lb

## 2021-05-12 DIAGNOSIS — E785 Hyperlipidemia, unspecified: Secondary | ICD-10-CM | POA: Diagnosis not present

## 2021-05-12 DIAGNOSIS — I10 Essential (primary) hypertension: Secondary | ICD-10-CM

## 2021-05-12 DIAGNOSIS — Z953 Presence of xenogenic heart valve: Secondary | ICD-10-CM

## 2021-05-12 DIAGNOSIS — I350 Nonrheumatic aortic (valve) stenosis: Secondary | ICD-10-CM

## 2021-05-12 DIAGNOSIS — R0609 Other forms of dyspnea: Secondary | ICD-10-CM

## 2021-05-12 NOTE — Patient Instructions (Signed)
Medication Instructions:   Your physician recommends that you continue on your current medications as directed. Please refer to the Current Medication list given to you today.  *If you need a refill on your cardiac medications before your next appointment, please call your pharmacy*   Testing/Procedures:  Your physician has requested that you have an echocardiogram. Echocardiography is a painless test that uses sound waves to create images of your heart. It provides your doctor with information about the size and shape of your heart and how well your hearts chambers and valves are working. This procedure takes approximately one hour. There are no restrictions for this procedure.  PLEASE SCHEDULE ECHO TO BE DONE IN June 2023 PER DR. Johney Frame   Follow-Up: At [facility], you and your health needs are our priority.  As part of our continuing mission to provide you with exceptional heart care, we have created designated Provider Care Teams.  These Care Teams include your primary Cardiologist (physician) and Advanced Practice Providers (APPs -  Physician Assistants and Nurse Practitioners) who all work together to provide you with the care you need, when you need it.  We recommend signing up for the patient portal called "MyChart".  Sign up information is provided on this After Visit Summary.  MyChart is used to connect with patients for Virtual Visits (Telemedicine).  Patients are able to view lab/test results, encounter notes, upcoming appointments, etc.  Non-urgent messages can be sent to your provider as well.   To learn more about what you can do with MyChart, go to [email or web address].    Your next appointment:   6 month(s)  The format for your next appointment:   In Person  Provider:   Robbie Lis, PA-C, Nicholes Rough, PA-C, Melina Copa, PA-C, Cecilie Kicks, NP, Ermalinda Barrios, PA-C, Christen Bame, NP, or Richardson Dopp, PA-C      :1}

## 2021-05-12 NOTE — Progress Notes (Signed)
Cardiology Office Note:    Date:  05/12/2021   ID:  William Welch, DOB 1965-07-31, MRN [id number]  PCP:  Donnajean Lopes, MD   [place]  Cardiologist:  Freada Bergeron, MD  Advanced Practice Provider:  No care team member to display Electrophysiologist:  None   Referring MD: Donnajean Lopes, MD    History of Present Illness:    William Welch is a 56 y.o. male with a hx of HTN, HLD, GERD and recent diagnosis of severe aortic stenosis who returns to clinic for follow-up.  Was initially seen in clinic on 06/16/20 where he was referred by his PCP for worsening DOE and a loud systolic murmur noted on examination. TTE obtained which showed AVA 0.6, mean gradient 56mmHg, Vmax 4.44 m/s. Was referred for coronary angiography which revealed mild, non-obstructive CAD. He was referred to Dr. Roxy Manns and is now s/p AVR with Oletta Lamas Inspiris Resilia stented bovine pericardial tissue valve (size 21 mm) with bovine pericardial patch enlargement of the aortic root (Nick's procedure) on 07/29/20. He did well post-operatively and he was discharged on 08/02/20.  Was last seen in clinic on 08/2020 where he was doing well post-operatively. TTE 09/2020 with EF 60-65%, normal functioning AVR.  Today, the patient states he continues to become winded very easily. This is only occurring with exertion. Lately, he has been less active and gained some weight. He works in Engineer, production, and is not performing as much physical work as he used to. He agrees that he may be deconditioned, and he generally does not feel as strong as he did previously.  Occasionally he does feel dizzy, which he attributes to not drinking enough water. No syncope, chest pain, LE edema or orthopnea. Overall his blood pressures are well controled.    He denies any palpitations, chest pain, headaches, syncope, orthopnea, PND, or lower extremity edema.   Past Medical History:  Diagnosis Date    Arthritis    GERD (gastroesophageal reflux disease)    Gout    Heart murmur    Hyperlipidemia    Hypertension    S/P aortic valve replacement with bioprosthetic valve 07/29/2020   21 mm Edwards Inspiris Resilia stented bovine pericardial tissue valve with bovine pericardial patch enlargement of the aortic root   Severe aortic stenosis     Past Surgical History:  Procedure Laterality Date   AORTIC ROOT ENLARGEMENT  07/29/2020   Procedure: AORTIC ROOT ENLARGEMENT USING 6CMX8CM PERI-GUARD PERICARDIAL PATCH;  Surgeon: Rexene Alberts, MD;  Location: [facility];  Service: Open Heart Surgery;;   AORTIC VALVE REPLACEMENT N/A 07/29/2020   Procedure: AORTIC VALVE REPLACEMENT (AVR) USING 21MM INSPIRIS RESILIA  AORTIC VALVE;  Surgeon: Rexene Alberts, MD;  Location: [facility];  Service: Open Heart Surgery;  Laterality: N/A;   FACIAL RECONSTRUCTION SURGERY     JOINT REPLACEMENT Right    knee   RIGHT/LEFT HEART CATH AND CORONARY ANGIOGRAPHY N/A 07/01/2020   Procedure: RIGHT/LEFT HEART CATH AND CORONARY ANGIOGRAPHY;  Surgeon: Burnell Blanks, MD;  Location: [facility];  Service: Cardiovascular;  Laterality: N/A;   TEE WITHOUT CARDIOVERSION N/A 07/29/2020   Procedure: TRANSESOPHAGEAL ECHOCARDIOGRAM (TEE);  Surgeon: Rexene Alberts, MD;  Location: [facility];  Service: Open Heart Surgery;  Laterality: N/A;    Current Medications: Current Meds  Medication Sig   allopurinol (ZYLOPRIM) 300 MG tablet Take 300 mg by mouth daily.   aspirin EC 325 MG EC tablet Take 1  tablet (325 mg total) by mouth daily.   colchicine 0.6 MG tablet Take 0.6 mg by mouth daily as needed (Gout).   ezetimibe (ZETIA) 10 MG tablet Take 1 tablet (10 mg total) by mouth daily.   metoprolol tartrate (LOPRESSOR) 25 MG tablet Take 1 tablet (25 mg total) by mouth 2 (two) times daily.   omeprazole (PRILOSEC) 20 MG capsule Take 20 mg by mouth daily.   rosuvastatin (CRESTOR) 20 MG tablet Take 20 mg by mouth daily.     Allergies:    Percocet [oxycodone-acetaminophen]   Social History   Socioeconomic History   Marital status: Married    Spouse name: Not on file   Number of children: Not on file   Years of education: Not on file   Highest education level: Not on file  Occupational History   Not on file  Tobacco Use   Smoking status: Never   Smokeless tobacco: Never  Vaping Use   Vaping Use: Never used  Substance and Sexual Activity   Alcohol use: Yes    Alcohol/week: 21.0 standard drinks    Types: 21 Cans of beer per week    Comment: 06/26/20   Drug use: Not Currently   Sexual activity: Not on file  Other Topics Concern   Not on file  Social History Narrative   Not on file   Social Determinants of Health   Financial Resource Strain: Not on file  Food Insecurity: Not on file  Transportation Needs: Not on file  Physical Activity: Not on file  Stress: Not on file  Social Connections: Not on file     Family History: The patient's family history is negative for Colon cancer, Esophageal cancer, Rectal cancer, and Stomach cancer.  ROS:   Please see the history of present illness.    Review of Systems  Constitutional:  Negative for chills, diaphoresis and fever.  HENT:  Negative for hearing loss.   Eyes:  Negative for blurred vision and redness.  Respiratory:  Positive for shortness of breath.   Cardiovascular:  Negative for chest pain, palpitations, orthopnea, claudication, leg swelling and PND.  Gastrointestinal:  Negative for melena, nausea and vomiting.  Genitourinary:  Negative for dysuria and flank pain.  Musculoskeletal:  Negative for falls.  Neurological:  Positive for dizziness. Negative for loss of consciousness.  Endo/Heme/Allergies:  Negative for polydipsia.  Psychiatric/Behavioral:  Negative for substance abuse.    EKGs/Labs/Other Studies Reviewed:    The following studies were reviewed today:  TTE 09/2020: IMPRESSIONS    1. There is a 21 mm Edwards Inspiris Resilia stented  bovine pericardial  tissue valve with bovine pericardial patch enlargement of the aortic root  valve present in the aortic position. Procedure Date: 07/29/20.   2. Left ventricular ejection fraction, by estimation, is 60 to 65%. The  left ventricle has normal function. The left ventricle has no regional  wall motion abnormalities. Left ventricular diastolic parameters are  indeterminate.   3. The mitral valve is normal in structure. Trivial mitral valve  regurgitation. No evidence of mitral stenosis.   4. Aortic root/ascending aorta has been repaired/replaced.   5. The inferior vena cava is normal in size with greater than 50%  respiratory variability, suggesting right atrial pressure of 3 mmHg.   6. Right ventricular systolic function is normal. The right ventricular  size is normal. Tricuspid regurgitation signal is inadequate for assessing  PA pressure.   Cardiac TAVR CT 07/10/2020: FINDINGS: Aortic Valve: Bicuspid calcified with restricted motion [place]  type one with raphe between right an non coronary cusps Thickened leaflet edges and raphe AV calcium score 2407 consistent with severe AS   Aorta: No aneurysm only mild atherosclerosis near innominate take off Normal branch vessels no coarctation   Sinotubular Junction: 23 mm   Ascending Thoracic Aorta: 32 mm   Aortic Arch: 25 mm   Descending Thoracic Aorta: 23 mm   Sinus of Valsalva Measurements:   Non-coronary: 27 mm   Right - coronary: 27 mm   Left - coronary: 28 mm   Coronary Artery Height above Annulus:   Left Main: 15.5 mm above annulus   Right Coronary: 13.3 mm above annulus   Virtual Basal Annulus Measurements:   Maximum/Minimum Diameter: 24.9 mm x 20.3 mm   Perimeter: 70 mm   Area: 387 mm 2   Coronary Arteries: Right dominant. Non obstructive <50% mixed plaque in proximal and mid LAD. 1-24% calcified plaque in proximal RCA and mid circumflex   Calcium score: 63 which is 46 th percentile for age  and sex   IMPRESSION: 1. Bicuspid Aortic valve [place] type one with severe AS and calcium score 2407 consistent with severe AS   2. Mild thoracic atherosclerosis with no aneurysm, coarctation and normal arch vessels   3. Coronary calcium score 63 which is 44 th percentile for age / sex. Non obstructive CAD Right dominant coronary arteries   4. Measurement for TAVR given above but patient appears to be having SAVR  TTE 06/19/20: IMPRESSIONS   1. The aortic valve is calcified. Aortic valve regurgitation is mild.  Severe aortic valve stenosis. Aortic valve area, by VTI measures 0.60 cm.  Aortic valve mean gradient measures 44.7 mmHg. Aortic valve Vmax measures  4.44 m/s. Through morphologically  unclear, given demographic data, diagnosis of biscuspid aortic valve  should be considered.   2. Left ventricular ejection fraction, by estimation, is 55 to 60%. The  left ventricle has normal function. The left ventricle has no regional  wall motion abnormalities. There is mild concentric left ventricular  hypertrophy. Left ventricular diastolic  parameters are consistent with Grade I diastolic dysfunction (impaired  relaxation).   3. Right ventricular systolic function is normal. The right ventricular  size is normal.   4. Left atrial size was mildly dilated.   5. The mitral valve is normal in structure. No evidence of mitral valve  regurgitation. No evidence of mitral stenosis.   6. The inferior vena cava is normal in size with greater than 50%  respiratory variability, suggesting right atrial pressure of 3 mmHg.  LHC 07/01/20: Ost RCA to Prox RCA lesion is 20% stenosed. Prox RCA to Mid RCA lesion is 20% stenosed. Mid LAD lesion is 20% stenosed.   Mild non-obstructive CAD Severe aortic stenosis (mean gradient by cath 37.5 mmHg, peak to peak gradient 54 mmHg, AVA 0.77cm2).    Recommendations: Will continue planning for AVR. He would be a candidate for surgical AVR or TAVR but  given his young age, he may be best treated with conventional surgical AVR. Will make a referral to our CT surgeons on the valve team.    EKG:   EKG is personally reviewed. 05/12/2021: EKG was not ordered. 09/01/20: NSR with HR 79   Recent Labs: 07/29/2020: ALT 33 08/01/2020: Magnesium 2.1 08/02/2020: BUN 12; Creatinine, Ser 1.12; Hemoglobin 9.0; Platelets 124; Potassium 3.9; Sodium 138   Recent Lipid Panel    Component Value Date/Time   CHOL 144 09/10/2020 1007   TRIG 68 09/10/2020  1007   HDL 42 09/10/2020 1007   CHOLHDL 3.4 09/10/2020 1007   LDLCALC 88 09/10/2020 1007     Risk Assessment/Calculations:       Physical Exam:    VS:  BP 112/70 (BP Location: Left Arm, Patient Position: Sitting, Cuff Size: Normal)    Pulse 92    Ht 5\' 6"  (1.676 m)    Wt 154 lb (69.9 kg)    SpO2 96%    BMI 24.86 kg/m     Wt Readings from Last 3 Encounters:  05/12/21 154 lb (69.9 kg)  11/30/20 151 lb 12.8 oz (68.9 kg)  09/01/20 143 lb (64.9 kg)     GEN:  Well nourished, well developed in no acute distress HEENT: Normal NECK: No JVD; No carotid bruits CARDIAC: RRR, 2/6 systolic murmur best heard at RUSB RESPIRATORY:  Clear to auscultation without rales, wheezing or rhonchi  ABDOMEN: Soft, non-tender, non-distended MUSCULOSKELETAL:  No edema; No deformity  SKIN: Warm and dry NEUROLOGIC:  Alert and oriented x 3 PSYCHIATRIC:  Normal affect   ASSESSMENT:    1. S/P aortic valve replacement with bioprosthetic valve   2. Hyperlipidemia, unspecified hyperlipidemia type   3. Severe aortic stenosis   4. Hypertension, unspecified type   5. Dyspnea on exertion    PLAN:    In order of problems listed above:  #Severe Aortic Stenosis s/p AVR: Pre-op TTE 06/2020 with AVA 0.6, mean gradient 76mmHg, Vmax 4.7m/s. Cath without obstructive disease. Now s/p AVR with Edwards Inspiris Resilia stented bovine pericardial tissue valve (size 21 mm) with bovine pericardial patch enlargement of the aortic root  (Nick's procedure) on 07/29/20 with Dr. Roxy Manns. Doing very well with no anginal or HF symptoms. TTE 09/2020 with EF 60-65%, mean aoV gradient 41mmHg. -Continue ASA 81mg  daily -Follow-up with CV surgery as scheduled  #DOE; Suspect secondary to deconditioning and weight gain. TTE with well seated AoV, normal EF. Pre-op cath without significant disease. No smoking or known pulmonary issues. Encouraged to increase exercise and monitor symptoms. -Increase exercise with goal of 111min/week  #HTN: Now significantly improved s/p AVR. -Off Diovan with blood pressure well controlled -Continue metop 25mg  BID  #Mild nonobstructive CAD:  #HLD: -Continue zetia 10mg  daily -Continue crestor 20mg  daily -LDL 88  #Orthostasis: Likely due to dehydration. Encouraged him to increase fluid intake. Will monitor blood pressures as well.  Follow-up:  6 months.  Medication Adjustments/Labs and Tests Ordered: Current medicines are reviewed at length with the patient today.  Concerns regarding medicines are outlined above.   Orders Placed This Encounter  Procedures   ECHOCARDIOGRAM COMPLETE   No orders of the defined types were placed in this encounter.  Patient Instructions  Medication Instructions:   Your physician recommends that you continue on your current medications as directed. Please refer to the Current Medication list given to you today.  *If you need a refill on your cardiac medications before your next appointment, please call your pharmacy*   Testing/Procedures:  Your physician has requested that you have an echocardiogram. Echocardiography is a painless test that uses sound waves to create images of your heart. It provides your doctor with information about the size and shape of your heart and how well your hearts chambers and valves are working. This procedure takes approximately one hour. There are no restrictions for this procedure.  PLEASE SCHEDULE ECHO TO BE DONE IN June 2023 PER DR.  Johney Frame   Follow-Up: At [facility], you and your health needs are our priority.  As part of our continuing mission to provide you with exceptional heart care, we have created designated Provider Care Teams.  These Care Teams include your primary Cardiologist (physician) and Advanced Practice Providers (APPs -  Physician Assistants and Nurse Practitioners) who all work together to provide you with the care you need, when you need it.  We recommend signing up for the patient portal called "MyChart".  Sign up information is provided on this After Visit Summary.  MyChart is used to connect with patients for Virtual Visits (Telemedicine).  Patients are able to view lab/test results, encounter notes, upcoming appointments, etc.  Non-urgent messages can be sent to your provider as well.   To learn more about what you can do with MyChart, go to [email or web address].    Your next appointment:   6 month(s)  The format for your next appointment:   In Person  Provider:   Robbie Lis, PA-C, Nicholes Rough, PA-C, Melina Copa, PA-C, Cecilie Kicks, NP, Ermalinda Barrios, PA-C, Christen Bame, NP, or Richardson Dopp, PA-C      :1}     I,Mathew Stumpf,acting as a scribe for Freada Bergeron, MD.,have documented all relevant documentation on the behalf of Freada Bergeron, MD,as directed by  Freada Bergeron, MD while in the presence of Freada Bergeron, MD.  I, Freada Bergeron, MD, have reviewed all documentation for this visit. The documentation on 05/12/21 for the exam, diagnosis, procedures, and orders are all accurate and complete.    Signed, Freada Bergeron, MD  05/12/2021 5:05 PM    [place]

## 2021-05-18 ENCOUNTER — Other Ambulatory Visit: Payer: Self-pay

## 2021-05-18 MED ORDER — METOPROLOL TARTRATE 25 MG PO TABS: 25.0000 mg | ORAL_TABLET | Freq: Two times a day (BID) | ORAL | 3 refills | Status: DC

## 2021-05-18 NOTE — Telephone Encounter (Signed)
Pt's medication was sent to pt's pharmacy as requested. Confirmation received.  °

## 2021-07-07 ENCOUNTER — Other Ambulatory Visit: Payer: Self-pay

## 2021-07-07 DIAGNOSIS — E785 Hyperlipidemia, unspecified: Secondary | ICD-10-CM

## 2021-07-07 MED ORDER — EZETIMIBE 10 MG PO TABS: 10.0000 mg | ORAL_TABLET | Freq: Every day | ORAL | 3 refills | Status: DC

## 2021-09-16 ENCOUNTER — Ambulatory Visit (HOSPITAL_COMMUNITY): Payer: Commercial Managed Care - PPO | Attending: Cardiology

## 2021-09-16 DIAGNOSIS — I5030 Unspecified diastolic (congestive) heart failure: Secondary | ICD-10-CM | POA: Diagnosis not present

## 2021-09-16 DIAGNOSIS — Z953 Presence of xenogenic heart valve: Secondary | ICD-10-CM | POA: Insufficient documentation

## 2021-09-16 LAB — ECHOCARDIOGRAM COMPLETE
AV Mean grad: 8.6 mmHg
AV Peak grad: 13.8 mmHg
Ao pk vel: 1.86 m/s
Area-P 1/2: 3.56 cm2
S' Lateral: 2.7 cm

## 2021-10-31 IMAGING — CT CT CTA ABD/PEL W/CM AND/OR W/O CM
1 of 3 series · 1 of 19 positions shown · IV contrast (omnipaque)
Comparison: None.
COMPARISON: None.

Addendum:
CLINICAL DATA: Aortic stenosis.  Pre-TAVR protocol.

EXAM:
CT CHEST, ABDOMEN AND PELVIS WITHOUT CONTRAST
TECHNIQUE: Multidetector CT imaging of the chest, abdomen and pelvis was
performed following the standard protocol without IV contrast.
TECHNIQUE: Multidetector CT imaging of the chest, abdomen and pelvis
was performed after administration of 80 cc Omnipaque 350 IV with
angiographic technique. Multiplanar maximum intensity projection
(MIP) reformats and 3D renderings were generated using dedicated 3D
workstation software.
*** End of Addendum ***

[Series 745: — · 0.45mm/px · 1 of 7 slices shown]
[im 4/7]
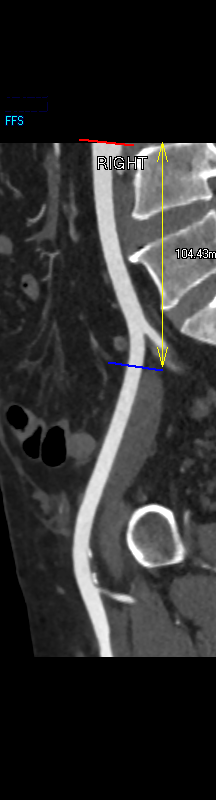

[1 of 19 positions shown; findings below may reference images not displayed]

FINDINGS: CTA CHEST FINDINGS

Cardiovascular: Normal heart size. No significant pericardial
effusion/thickening. Left anterior descending coronary
atherosclerosis. Diffusely coarsely calcified and thick and aortic
root. Great vessels are normal in course and caliber. No central
pulmonary emboli.

Mediastinum/Nodes: No discrete thyroid nodules. Unremarkable
esophagus. No pathologically enlarged axillary, mediastinal or hilar
lymph nodes.

Lungs/Pleura: No pneumothorax. No pleural effusion. No acute
consolidative airspace disease, lung masses or significant pulmonary
nodules.

Musculoskeletal: No aggressive appearing focal osseous lesions. Mild
thoracic spondylosis.

CTA ABDOMEN AND PELVIS FINDINGS

Hepatobiliary: Normal liver with no liver mass. Normal gallbladder
with no radiopaque cholelithiasis. No biliary ductal dilatation.

Pancreas: Normal, with no mass or duct dilation.

Spleen: Normal size. No mass.

Adrenals/Urinary Tract: Normal adrenals. No contour deforming renal
masses. No hydronephrosis. Normal bladder.

Stomach/Bowel: Small hiatal hernia. Otherwise normal nondistended
stomach. Normal caliber small bowel with no small bowel wall
thickening. Normal appendix. Mild sigmoid diverticulosis with no
large bowel wall thickening or significant pericolonic fat
stranding.

Vascular/Lymphatic: Mildly atherosclerotic nonaneurysmal abdominal
aorta. Patent renal veins. No pathologically enlarged lymph nodes in
the abdomen or pelvis.

Reproductive: Normal size prostate.

Other: No pneumoperitoneum, ascites or focal fluid collection.

Musculoskeletal: No aggressive appearing focal osseous lesions. Mild
lumbar spondylosis.

VASCULAR MEASUREMENTS PERTINENT TO TAVR:

AORTA:

Minimal Aortic Diameter-QC.J x 12.0 mm

Severity of Aortic Calcification-mild

RIGHT PELVIS:

Right Common Iliac Artery -

Minimal Fiameter-V.V x 7.2 mm

Tortuosity-mild

Calcification-mild

Right External Iliac Artery -

Minimal Viameter-J.J x 6.5 mm

Tortuosity-mild

Calcification-none

Right Common Femoral Artery -

Minimal 5iameter-X.C x 6.6 mm

Tortuosity-mild

Calcification-mild

LEFT PELVIS:

Left Common Iliac Artery -

Minimal Oiameter-K.2 x 7.3 mm

Tortuosity-mild

Calcification-mild

Left External Iliac Artery -

Minimal 2iameter-O.T x 6.6 mm

Tortuosity-mild

Calcification-none

Left Common Femoral Artery -

Minimal Oiameter-K.2 x 5.8 mm

Tortuosity-mild

Calcification-moderate

Review of the MIP images confirms the above findings.
IMPRESSION: 1. Vascular findings and measurements pertinent to potential TAVR
procedure, as detailed.
2. Diffusely coarsely calcified and thickened aortic valve,
compatible with reported history of aortic stenosis.
3. One vessel coronary atherosclerosis.
4. Small hiatal hernia.
5. Mild sigmoid diverticulosis.
6. Aortic Atherosclerosis ([id number]-7WC.C).

ADDENDUM:
FINDINGS: CTA CHEST FINDINGS

Cardiovascular: Normal heart size. No significant pericardial
effusion/thickening. Left anterior descending coronary
atherosclerosis. Diffusely coarsely calcified and thick and aortic
root. Great vessels are normal in course and caliber. No central
pulmonary emboli.

Mediastinum/Nodes: No discrete thyroid nodules. Unremarkable
esophagus. No pathologically enlarged axillary, mediastinal or hilar
lymph nodes.

Lungs/Pleura: No pneumothorax. No pleural effusion. No acute
consolidative airspace disease, lung masses or significant pulmonary
nodules.

Musculoskeletal: No aggressive appearing focal osseous lesions. Mild
thoracic spondylosis.

CTA ABDOMEN AND PELVIS FINDINGS

Hepatobiliary: Normal liver with no liver mass. Normal gallbladder
with no radiopaque cholelithiasis. No biliary ductal dilatation.

Pancreas: Normal, with no mass or duct dilation.

Spleen: Normal size. No mass.

Adrenals/Urinary Tract: Normal adrenals. No contour deforming renal
masses. No hydronephrosis. Normal bladder.

Stomach/Bowel: Small hiatal hernia. Otherwise normal nondistended
stomach. Normal caliber small bowel with no small bowel wall
thickening. Normal appendix. Mild sigmoid diverticulosis with no
large bowel wall thickening or significant pericolonic fat
stranding.

Vascular/Lymphatic: Mildly atherosclerotic nonaneurysmal abdominal
aorta. Patent renal veins. No pathologically enlarged lymph nodes in
the abdomen or pelvis.

Reproductive: Normal size prostate.

Other: No pneumoperitoneum, ascites or focal fluid collection.

Musculoskeletal: No aggressive appearing focal osseous lesions. Mild
lumbar spondylosis.

VASCULAR MEASUREMENTS PERTINENT TO TAVR:

AORTA:

Minimal Aortic Diameter-QC.J x 12.0 mm

Severity of Aortic Calcification-mild

RIGHT PELVIS:

Right Common Iliac Artery -

Minimal Fiameter-V.V x 7.2 mm

Tortuosity-mild

Calcification-mild

Right External Iliac Artery -

Minimal Viameter-J.J x 6.5 mm

Tortuosity-mild

Calcification-none

Right Common Femoral Artery -

Minimal 5iameter-X.C x 6.6 mm

Tortuosity-mild

Calcification-mild

LEFT PELVIS:

Left Common Iliac Artery -

Minimal Oiameter-K.2 x 7.3 mm

Tortuosity-mild

Calcification-mild

Left External Iliac Artery -

Minimal 2iameter-O.T x 6.6 mm

Tortuosity-mild

Calcification-none

Left Common Femoral Artery -

Minimal Oiameter-K.2 x 5.8 mm

Tortuosity-mild

Calcification-moderate

Review of the MIP images confirms the above findings.
IMPRESSION: 1. Vascular findings and measurements pertinent to potential TAVR
procedure, as detailed.
2. Diffusely coarsely calcified and thickened aortic valve,
compatible with reported history of aortic stenosis.
3. One vessel coronary atherosclerosis.
4. Small hiatal hernia.
5. Mild sigmoid diverticulosis.
6. Aortic Atherosclerosis (FBBJO-7WC.C).

## 2021-11-19 IMAGING — DX DG CHEST 1V PORT
1 series · 1 of 1 positions shown · non-contrast
Comparison: PA and lateral chest 07/27/2020.

CLINICAL DATA: Patient status post aortic valve replacement today.

EXAM:
PORTABLE CHEST 1 VIEW

[chest ap]
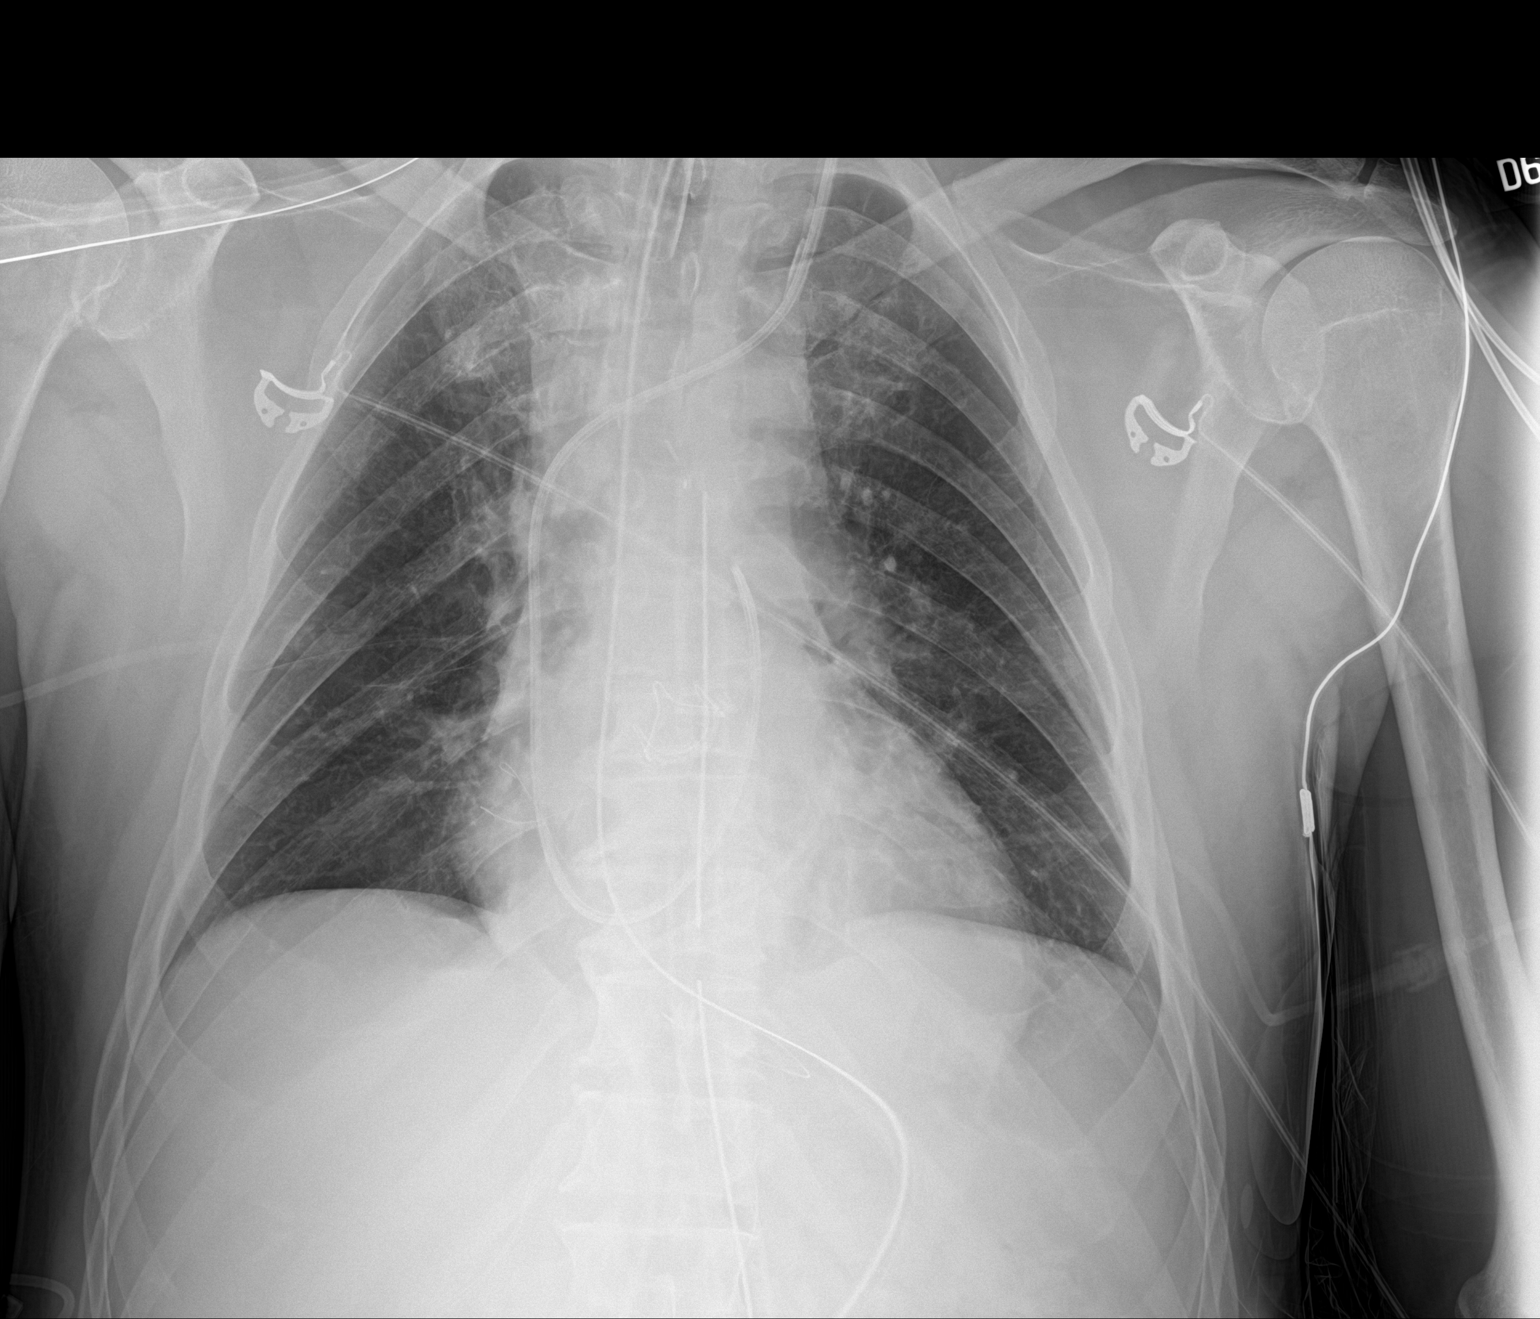

[1 of 1 positions shown; findings below may reference images not displayed]

FINDINGS: Endotracheal tube tip is in good position just above the clavicular
heads. Left IJ approach Swan-Ganz catheter tip is in the distal
pulmonary outflow tract. NG tube courses into the stomach and below
the inferior margin the film. Mediastinal drain noted. Heart size is
normal. Prosthetic aortic valve is now in place. Lungs are clear. No
pneumothorax.
IMPRESSION: Support tubes and lines as described.

Lungs clear.

## 2021-11-22 IMAGING — CR DG CHEST 2V
2 series · 2 of 2 positions shown · non-contrast
Comparison: 07/31/20

CLINICAL DATA: Status post aortic valve replacement with
bioprosthetic valve.

EXAM:
CHEST - 2 VIEW

[chest pa]
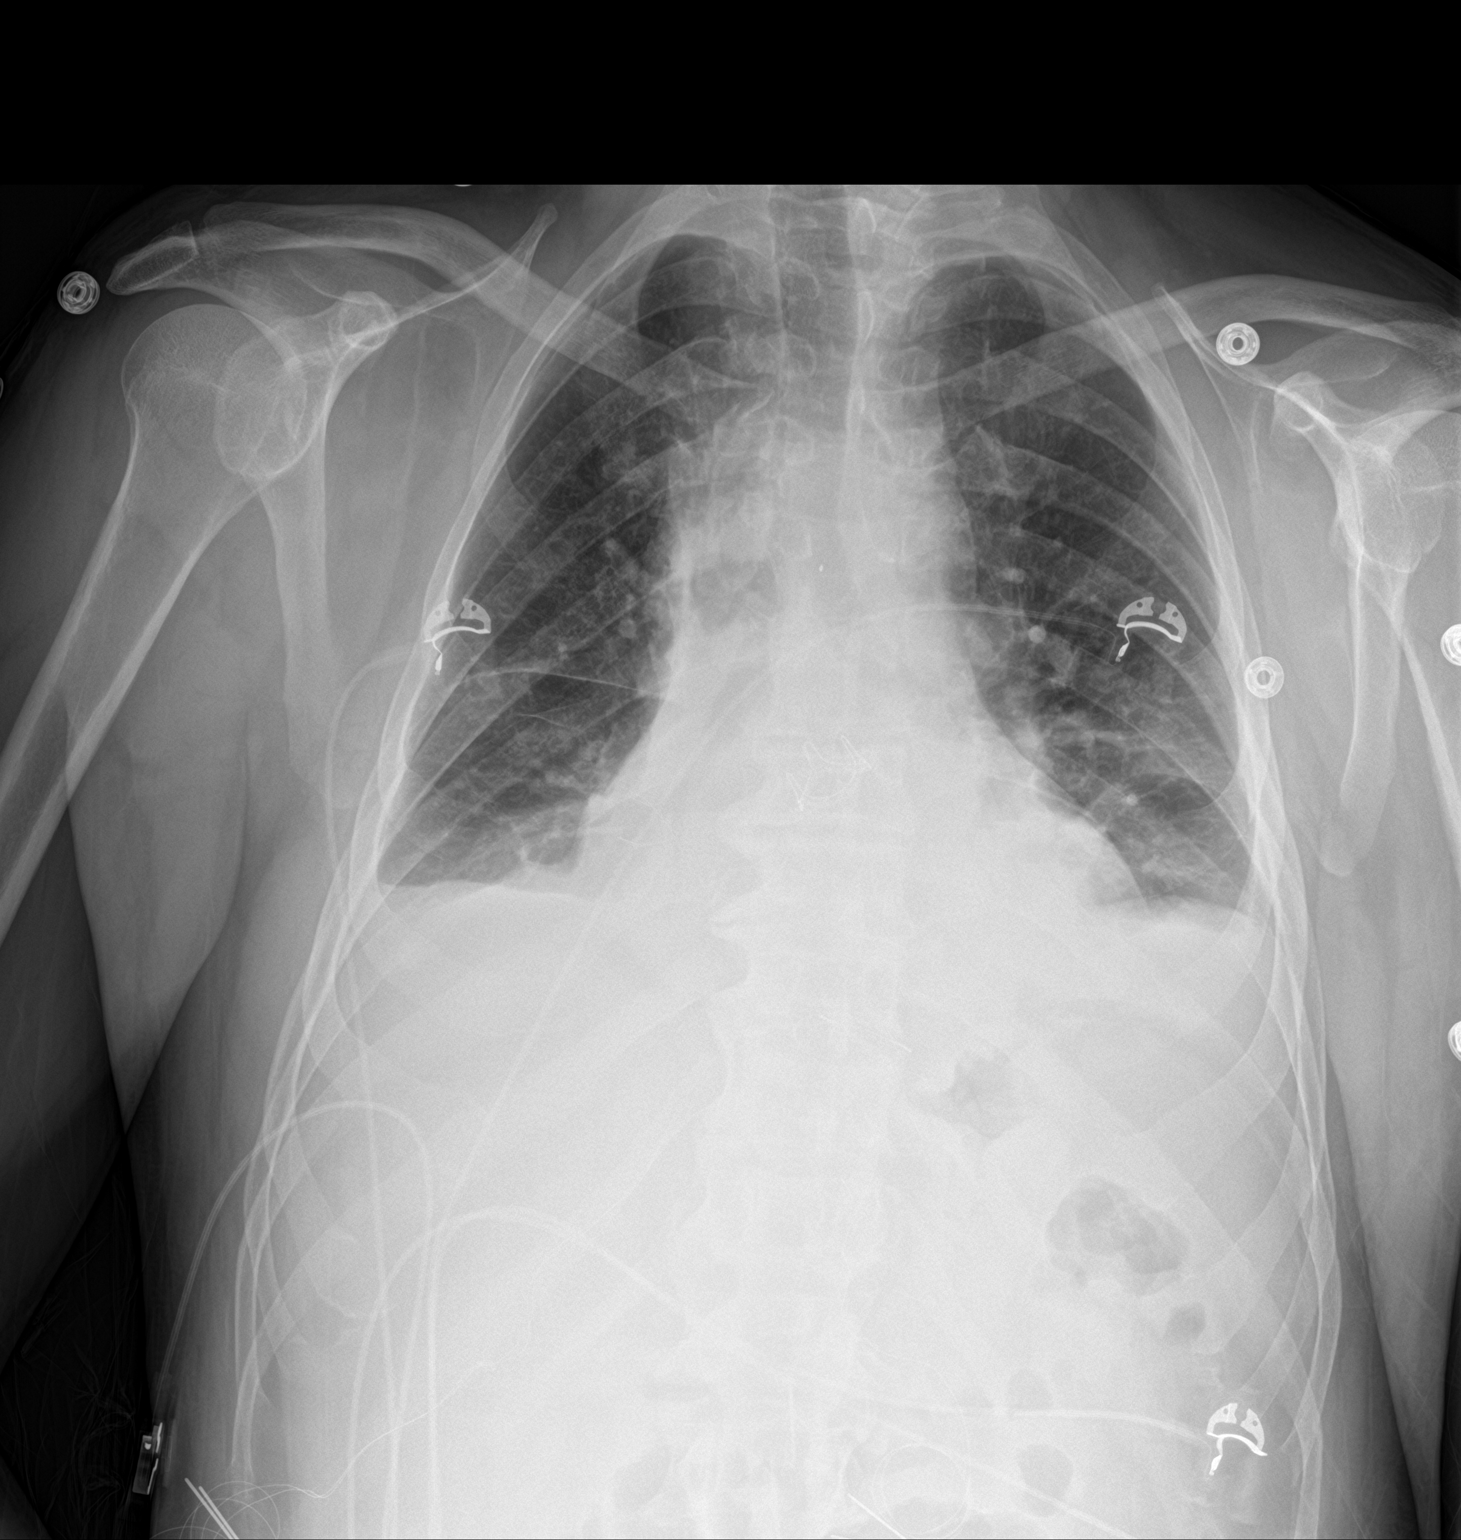

[chest lat]
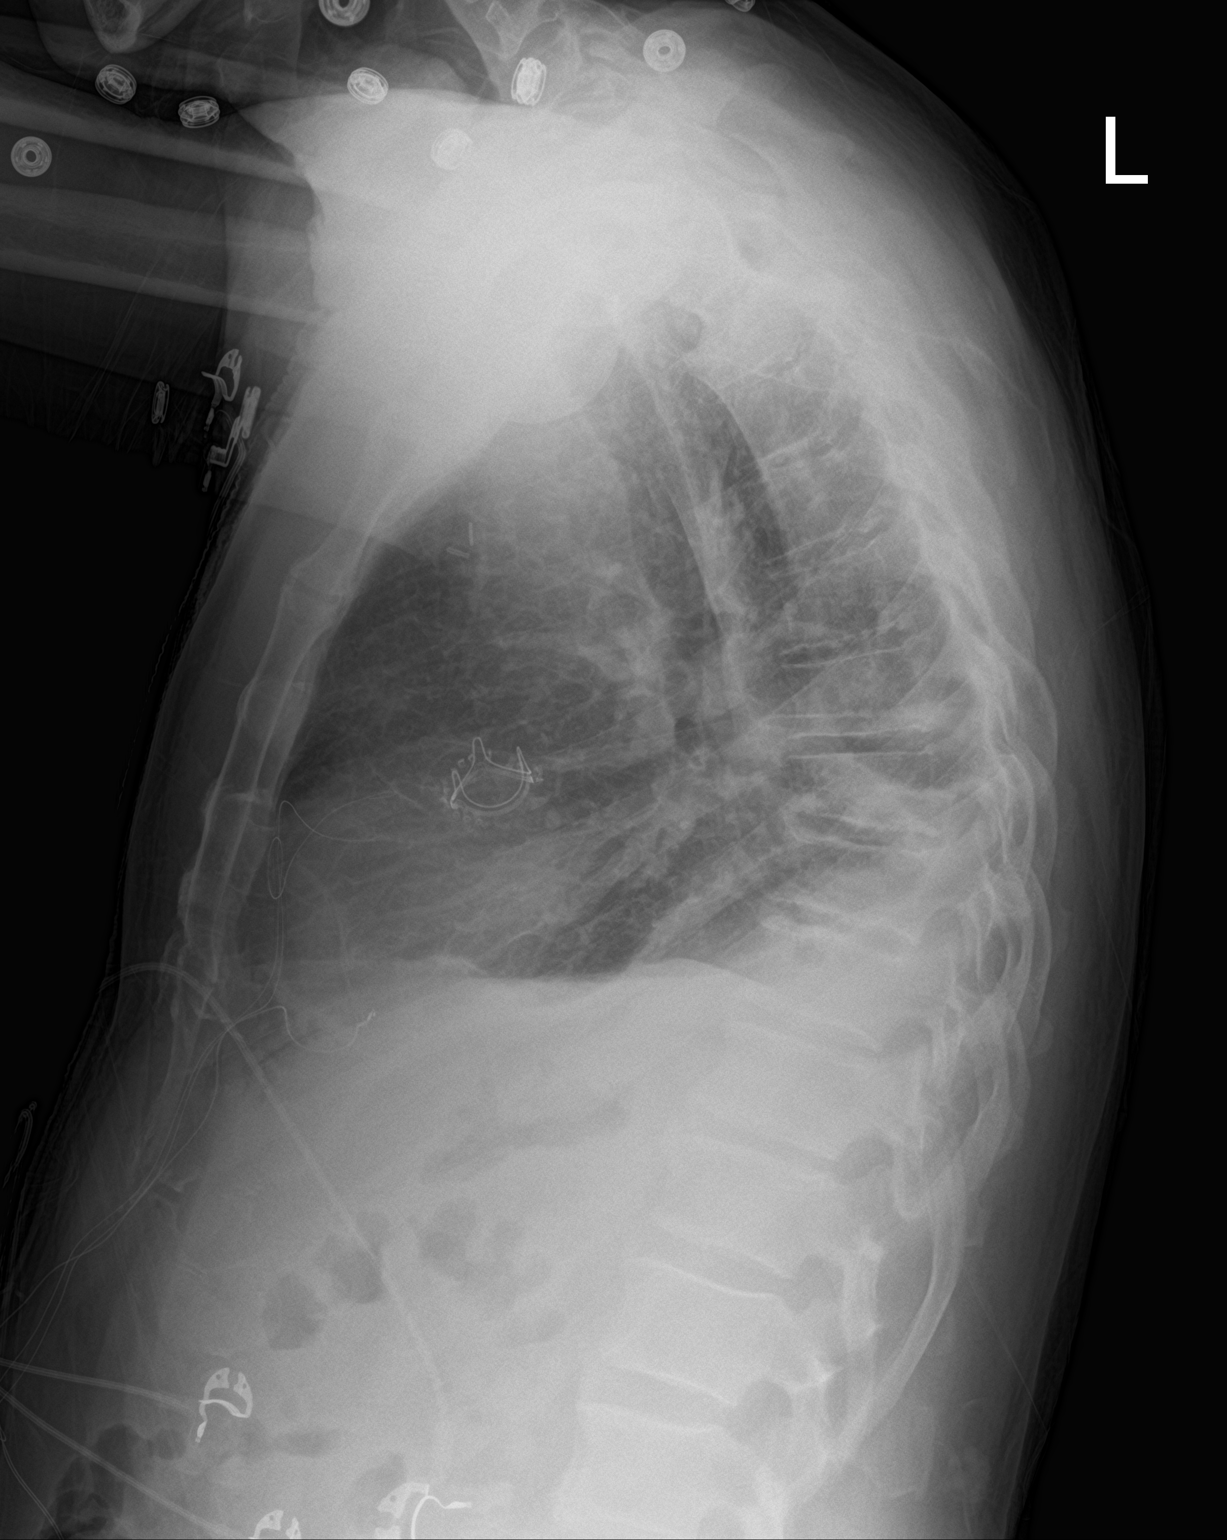

[2 of 2 positions shown; findings below may reference images not displayed]

FINDINGS: Stable cardiomediastinal contours. Small bilateral pleural effusions
and bibasilar atelectasis, unchanged. No airspace consolidation.
IMPRESSION: No change in small bilateral pleural effusions with bibasilar
atelectasis.

## 2022-01-12 ENCOUNTER — Encounter: Payer: Self-pay | Admitting: Cardiology

## 2022-07-13 ENCOUNTER — Other Ambulatory Visit: Payer: Self-pay | Admitting: *Deleted

## 2022-07-13 DIAGNOSIS — E785 Hyperlipidemia, unspecified: Secondary | ICD-10-CM

## 2022-07-13 MED ORDER — EZETIMIBE 10 MG PO TABS: 10.0000 mg | ORAL_TABLET | Freq: Every day | ORAL | 0 refills | Status: DC

## 2022-08-12 ENCOUNTER — Other Ambulatory Visit: Payer: Self-pay

## 2022-08-12 DIAGNOSIS — E785 Hyperlipidemia, unspecified: Secondary | ICD-10-CM

## 2022-08-12 MED ORDER — EZETIMIBE 10 MG PO TABS: 10.0000 mg | ORAL_TABLET | Freq: Every day | ORAL | 3 refills | Status: DC

## 2022-10-10 ENCOUNTER — Other Ambulatory Visit: Payer: Self-pay

## 2022-10-10 MED ORDER — METOPROLOL TARTRATE 25 MG PO TABS: 25.0000 mg | ORAL_TABLET | Freq: Two times a day (BID) | ORAL | 0 refills | Status: DC

## 2022-12-13 ENCOUNTER — Other Ambulatory Visit: Payer: Self-pay | Admitting: *Deleted

## 2022-12-13 MED ORDER — METOPROLOL TARTRATE 25 MG PO TABS: 25.0000 mg | ORAL_TABLET | Freq: Two times a day (BID) | ORAL | 0 refills | Status: DC

## 2023-01-06 ENCOUNTER — Other Ambulatory Visit: Payer: Self-pay | Admitting: Cardiology

## 2023-01-12 ENCOUNTER — Telehealth: Payer: Self-pay | Admitting: Cardiology

## 2023-01-12 ENCOUNTER — Other Ambulatory Visit: Payer: Self-pay | Admitting: Cardiology

## 2023-01-12 MED ORDER — METOPROLOL TARTRATE 25 MG PO TABS: 25.0000 mg | ORAL_TABLET | Freq: Two times a day (BID) | ORAL | 0 refills | Status: DC

## 2023-01-12 NOTE — Telephone Encounter (Signed)
Rx(s) sent to pharmacy electronically.  Patient notified directly and voiced understanding. 

## 2023-01-12 NOTE — Telephone Encounter (Signed)
*  STAT* If patient is at the pharmacy, call can be transferred to refill team.   1. Which medications need to be refilled? (please list name of each medication and dose if known) metoprolol tartrate (LOPRESSOR) 25 MG tablet   2. Which pharmacy/location (including street and city if local pharmacy) is medication to be sent to?  [facility] - [place], [place] - [address]    3. Do they need a 30 day or 90 day supply? 90   Patient has appt schedule for 12/31.

## 2023-04-10 NOTE — Progress Notes (Signed)
 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 male with a PMH of severe aortic stenosis s/p AVR repair 07/2020, HTN, HLD, GERD who presents today for 1 year follow-up.  [name] was seen initially by Dr. Sorrow in 2022 for evaluation of dyspnea on exertion and was found to have systolic murmur on exam.  He underwent R/LHC disease and TEE was also performed that showed AVA 0.6 mean gradient 44 mmHg.  He was referred to Dr. Dusty and underwent aortic valve repair on 07/2020 with index procedure for aortic root enlargement.  He did well postprocedure repeat TTE 08/2020 showed normal functioning AVR EF of 60-65%.  He was last seen by Dr. Sorrow on 05/12/2021 and endorsed doing well but still becoming winded very easily.  It was thought to be related to deconditioning due to increased weight gain.  He also noted occasional episodes of dizziness that were attributed to low fluid intake.  His blood pressure was controlled was off of Diovan  but continue metoprolol  25 mg twice daily.  [name] presents today for overdue follow-up and to reestablish care.  He was previously followed by Dr. Sorrow and was last seen in February 2023.   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 The breathlessness is quickly relieved with rest. They also report occasional episodes of dizziness, which have improved with increased fluid intake.  They have gained some weight and their activity level outside of work is low, suggesting deconditioning as a possible contributor to their symptoms.  We discussed making lifestyle modifications with increasing physical activity such as walking.  His blood pressure today was well-controlled at 120/82 and heart rate is stable at 68 bpm. Patient denies chest pain, palpitations, , PND, orthopnea, nausea, vomiting, dizziness, syncope, edema, weight gain, or early satiety.   Review of Systems  Please see the history of present illness.    All other systems reviewed and are otherwise negative except as noted above.  Physical Exam    Wt Readings from Last 3 Encounters:  05/12/21 154 lb (69.9 kg)  11/30/20 151 lb 12.8 oz (68.9 kg)  09/01/20 143 lb (64.9 kg)   CD:Uyzmz were no vitals filed for this visit.,There is no height or weight on file to calculate BMI. GEN: Well nourished, well developed in no acute distress Neck: No JVD; No carotid bruits Pulmonary: Clear to auscultation without rales, wheezing or rhonchi  Cardiovascular: Normal rate. Regular rhythm. Normal S1. Normal S2.   Murmurs: There is no murmur.  ABDOMEN: Soft, non-tender, non-distended EXTREMITIES:  No edema; No deformity   EKG/LABS/ Recent Cardiac Studies   ECG personally reviewed by me today -sinus rhythm of 60 bpm and left axis deviation no acute changes consistent  with previous EKG.  Risk Assessment/Calculations:          Lab Results  Component Value Date   WBC 9.0 08/02/2020   HGB 9.0 (L) 08/02/2020   HCT 27.1 (L) 08/02/2020   MCV 96.8 08/02/2020   PLT 124 (L) 08/02/2020   Lab Results  Component Value Date   CREATININE 1.12 08/02/2020   BUN 12 08/02/2020   NA 138 08/02/2020   K 3.9 08/02/2020   CL 102 08/02/2020   CO2 27 08/02/2020   Lab  Results  Component Value Date   CHOL 144 09/10/2020   HDL 42 09/10/2020   LDLCALC 88 09/10/2020   TRIG 68 09/10/2020   CHOLHDL 3.4 09/10/2020    Lab Results  Component Value Date   HGBA1C 5.7 (H) 07/27/2020   Assessment & Plan    1.  Severe aortic disease: -s/p AVR repair 07/2020 with last TTE completed 09/2021 showing stable AV gradient normal valve function -SBE prophylaxis was discussed -We will check surveillance echo to evaluate valve function and patient will be established with structural heart specialist  2.  Essential hypertension: -Patient's blood pressure today was stable at 120/82 -Continue Toprol -XL 25 mg daily  3.  Hyperlipidemia: -Patient's last LDL cholesterol was above goal of less than 70 currently followed by PCP -He will initiate lifestyle modifications and we will continue current medication therapy with ezetimibe  10 mg and Crestor  20 mg daily  4.  Mild nonobstructive CAD: -She denies any chest pain or angina since previous follow-up. -Continue GDMT with Toprol -XL 25 mg daily, ezetimibe  10 mg daily, Crestor  20 mg daily and ASA 25 mg  5. Dyspnea on exertion: Occurs with vigorous activity, possibly due to deconditioning. No prolonged recovery period. -Encourage initiation of a walking program or gym attendance. -If shortness of breath persists despite increased physical activity, further investigation will be considered  Disposition: Follow-up with Dr. Verlin or APP in 6 months    Signed, Wyn Raddle, Jackee Shove, NP 04/10/2023, 7:18 AM [facility]

## 2023-04-11 ENCOUNTER — Ambulatory Visit: Payer: Commercial Managed Care - PPO | Attending: Nurse Practitioner | Admitting: Nurse Practitioner

## 2023-04-11 ENCOUNTER — Encounter: Payer: Self-pay | Admitting: Nurse Practitioner

## 2023-04-11 VITALS — BP 120/82 | HR 68 | Ht 66.0 in | Wt 149.6 lb

## 2023-04-11 DIAGNOSIS — I10 Essential (primary) hypertension: Secondary | ICD-10-CM | POA: Diagnosis not present

## 2023-04-11 DIAGNOSIS — R0609 Other forms of dyspnea: Secondary | ICD-10-CM

## 2023-04-11 DIAGNOSIS — Z953 Presence of xenogenic heart valve: Secondary | ICD-10-CM

## 2023-04-11 DIAGNOSIS — E785 Hyperlipidemia, unspecified: Secondary | ICD-10-CM | POA: Diagnosis not present

## 2023-04-11 DIAGNOSIS — R0600 Dyspnea, unspecified: Secondary | ICD-10-CM

## 2023-04-11 DIAGNOSIS — I350 Nonrheumatic aortic (valve) stenosis: Secondary | ICD-10-CM | POA: Diagnosis not present

## 2023-04-11 MED ORDER — METOPROLOL SUCCINATE ER 25 MG PO TB24: 25.0000 mg | ORAL_TABLET | Freq: Every day | ORAL | 3 refills | Status: DC

## 2023-04-11 MED ORDER — EZETIMIBE 10 MG PO TABS: 10.0000 mg | ORAL_TABLET | Freq: Every day | ORAL | 3 refills | Status: AC

## 2023-04-11 MED ORDER — ROSUVASTATIN CALCIUM 20 MG PO TABS: 20.0000 mg | ORAL_TABLET | Freq: Every day | ORAL | 3 refills | Status: AC

## 2023-04-11 NOTE — Patient Instructions (Signed)
 Medication Instructions:  Your physician has recommended you make the following change in your medication:  STOP Lopresso  START Toprol  Xl 25 taking 1 daily  *If you need a refill on your cardiac medications before your next appointment, please call your pharmacy*   Lab Work: None ordered  If you have labs (blood work) drawn today and your tests are completely normal, you will receive your results only by: MyChart Message (if you have MyChart) OR A paper copy in the mail If you have any lab test that is abnormal or we need to change your treatment, we will call you to review the results.   Testing/Procedures: Your physician has requested that you have an echocardiogram. Echocardiography is a painless test that uses sound waves to create images of your heart. It provides your doctor with information about the size and shape of your heart and how well your heart's chambers and valves are working. This procedure takes approximately one hour. There are no restrictions for this procedure. Please do NOT wear cologne, perfume, aftershave, or lotions (deodorant is allowed). Please arrive 15 minutes prior to your appointment time.  Please note: We ask at that you not bring children with you during ultrasound (echo/ vascular) testing. Due to room size and safety concerns, children are not allowed in the ultrasound rooms during exams. Our front office staff cannot provide observation of children in our lobby area while testing is being conducted. An adult accompanying a patient to their appointment will only be allowed in the ultrasound room at the discretion of the ultrasound technician under special circumstances. We apologize for any inconvenience.    Follow-Up: At [facility], you and your health needs are our priority.  As part of our continuing mission to provide you with exceptional heart care, we have created designated Provider Care Teams.  These Care Teams include your primary  Cardiologist (physician) and Advanced Practice Providers (APPs -  Physician Assistants and Nurse Practitioners) who all work together to provide you with the care you need, when you need it.  We recommend signing up for the patient portal called MyChart.  Sign up information is provided on this After Visit Summary.  MyChart is used to connect with patients for Virtual Visits (Telemedicine).  Patients are able to view lab/test results, encounter notes, upcoming appointments, etc.  Non-urgent messages can be sent to your provider as well.   To learn more about what you can do with MyChart, go to [email or web address].    Your next appointment:   6 month(s)  Provider:   Lonni Cash, MD     Other Instructions

## 2023-04-14 ENCOUNTER — Encounter (HOSPITAL_COMMUNITY): Payer: Self-pay

## 2023-04-14 ENCOUNTER — Ambulatory Visit (HOSPITAL_COMMUNITY): Payer: Commercial Managed Care - PPO | Attending: Nurse Practitioner

## 2023-04-14 DIAGNOSIS — Z953 Presence of xenogenic heart valve: Secondary | ICD-10-CM | POA: Insufficient documentation

## 2023-04-14 DIAGNOSIS — I350 Nonrheumatic aortic (valve) stenosis: Secondary | ICD-10-CM | POA: Insufficient documentation

## 2023-04-14 LAB — ECHOCARDIOGRAM COMPLETE
AR max vel: 1.5 cm2
AV Area VTI: 1.5 cm2
AV Area mean vel: 1.47 cm2
AV Mean grad: 8.5 mm[Hg]
AV Peak grad: 15.6 mm[Hg]
Ao pk vel: 1.98 m/s
Area-P 1/2: 3.31 cm2
S' Lateral: 2.4 cm

## 2023-09-11 ENCOUNTER — Encounter: Payer: Self-pay | Admitting: Cardiovascular Disease

## 2023-09-11 ENCOUNTER — Ambulatory Visit: Payer: Commercial Managed Care - PPO | Attending: Cardiovascular Disease | Admitting: Cardiovascular Disease

## 2023-09-11 VITALS — BP 118/80 | HR 72 | Ht 66.0 in | Wt 152.4 lb

## 2023-09-11 DIAGNOSIS — Z953 Presence of xenogenic heart valve: Secondary | ICD-10-CM

## 2023-09-11 DIAGNOSIS — I10 Essential (primary) hypertension: Secondary | ICD-10-CM | POA: Diagnosis not present

## 2023-09-11 DIAGNOSIS — I2510 Atherosclerotic heart disease of native coronary artery without angina pectoris: Secondary | ICD-10-CM

## 2023-09-11 MED ORDER — AMOXICILLIN 500 MG PO TABS: ORAL_TABLET | ORAL | 2 refills | Status: AC

## 2023-09-11 NOTE — Progress Notes (Signed)
 Chief Complaint  Patient presents with   Follow-up    Aortic valve disease   History of Present Illness: 58 yo male with history of severe aortic stenosis s/p surgical AVR in 2022, mild CAD, HTN, HLD and GERD who is here today for follow up. He has been followed in our office by Dr. Ardell Beauvais. He had severe AS and had a bioprosthetic AVR placed in April of 2022. Cardiac cath March 2022 with mild CAD. Echo January 2025 with LVEF=55=60%. AVR working well.   He is here today for follow up. The patient denies any chest pain, dyspnea, palpitations, lower extremity edema, orthopnea, PND, dizziness, near syncope or syncope.   Primary Care Physician: Bertha Broad, MD   Past Medical History:  Diagnosis Date   Arthritis    GERD (gastroesophageal reflux disease)    Gout    Heart murmur    Hyperlipidemia    Hypertension    S/P aortic valve replacement with bioprosthetic valve 07/29/2020   21 mm Edwards Inspiris Resilia stented bovine pericardial tissue valve with bovine pericardial patch enlargement of the aortic root   Severe aortic stenosis     Past Surgical History:  Procedure Laterality Date   AORTIC ROOT ENLARGEMENT  07/29/2020   Procedure: AORTIC ROOT ENLARGEMENT USING 6CMX8CM PERI-GUARD PERICARDIAL PATCH;  Surgeon: Gardenia Jump, MD;  Location: [facility] OR;  Service: Open Heart Surgery;;   AORTIC VALVE REPLACEMENT N/A 07/29/2020   Procedure: AORTIC VALVE REPLACEMENT (AVR) USING INSPIRIS RESILIA  AORTIC VALVE;  Surgeon: Gardenia Jump, MD;  Location: [facility] OR;  Service: Open Heart Surgery;  Laterality: N/A;   FACIAL RECONSTRUCTION SURGERY     JOINT REPLACEMENT Right    knee   RIGHT/LEFT HEART CATH AND CORONARY ANGIOGRAPHY N/A 07/01/2020   Procedure: RIGHT/LEFT HEART CATH AND CORONARY ANGIOGRAPHY;  Surgeon: Odie Benne, MD;  Location: [facility] INVASIVE CV LAB;  Service: Cardiovascular;  Laterality: N/A;   TEE WITHOUT CARDIOVERSION N/A 07/29/2020   Procedure: TRANSESOPHAGEAL  ECHOCARDIOGRAM (TEE);  Surgeon: Gardenia Jump, MD;  Location: [facility] OR;  Service: Open Heart Surgery;  Laterality: N/A;    Current Outpatient Medications  Medication Sig Dispense Refill   allopurinol  (ZYLOPRIM ) 300 MG tablet Take 300 mg by mouth daily.     amoxicillin (AMOXIL) 500 MG tablet Take FOUR tablets (2000 mg) one hour before any dental procedures 12 tablet 2   aspirin  EC 325 MG EC tablet Take 1 tablet (325 mg total) by mouth daily. 30 tablet 0   colchicine 0.6 MG tablet Take 0.6 mg by mouth daily as needed (Gout).     ezetimibe  (ZETIA ) 10 MG tablet Take 1 tablet (10 mg total) by mouth daily. 90 tablet 3   metoprolol  succinate (TOPROL  XL) 25 MG 24 hr tablet Take 1 tablet (25 mg total) by mouth daily. 90 tablet 3   omeprazole (PRILOSEC) 20 MG capsule Take 20 mg by mouth daily.     rosuvastatin  (CRESTOR ) 20 MG tablet Take 1 tablet (20 mg total) by mouth daily. 90 tablet 3   No current facility-administered medications for this visit.    Allergies  Allergen Reactions   Oxycodone  Hcl Nausea And Vomiting   Percocet [Oxycodone -Acetaminophen ] Hives    Social History   Socioeconomic History   Marital status: Married    Spouse name: Not on file   Number of children: Not on file   Years of education: Not on file   Highest education level: Not on file  Occupational  History   Not on file  Tobacco Use   Smoking status: Never   Smokeless tobacco: Never  Vaping Use   Vaping status: Never Used  Substance and Sexual Activity   Alcohol use: Yes    Alcohol/week: 21.0 standard drinks of alcohol    Types: 21 Cans of beer per week    Comment: 06/26/20   Drug use: Not Currently   Sexual activity: Not on file  Other Topics Concern   Not on file  Social History Narrative   Not on file   Social Drivers of Health   Financial Resource Strain: Not on file  Food Insecurity: Not on file  Transportation Needs: Not on file  Physical Activity: Not on file  Stress: Not on file  Social  Connections: Not on file  Intimate Partner Violence: Not on file    Family History  Problem Relation Age of Onset   Colon cancer Neg Hx    Esophageal cancer Neg Hx    Rectal cancer Neg Hx    Stomach cancer Neg Hx     Review of Systems:  As stated in the HPI and otherwise negative.   BP 118/80   Pulse 72   Ht 5\' 6"  (1.676 m)   Wt 152 lb 6.4 oz (69.1 kg)   SpO2 96%   BMI 24.60 kg/m   Physical Examination: General: Well developed, well nourished, NAD  HEENT: OP clear, mucus membranes moist  SKIN: warm, dry. No rashes. Neuro: No focal deficits  Musculoskeletal: Muscle strength 5/5 all ext  Psychiatric: Mood and affect normal  Neck: No JVD, no carotid bruits, no thyromegaly, no lymphadenopathy.  Lungs:Clear bilaterally, no wheezes, rhonci, crackles Cardiovascular: Regular rate and rhythm. No murmurs, gallops or rubs. Abdomen:Soft. Bowel sounds present. Non-tender.  Extremities: No lower extremity edema. Pulses are 2 + in the bilateral DP/PT.  EKG:  EKG is not ordered today. The ekg ordered today demonstrates   Recent Labs: No results found for requested labs within last 365 days.   Lipid Panel    Component Value Date/Time   CHOL 144 09/10/2020 1007   TRIG 68 09/10/2020 1007   HDL 42 09/10/2020 1007   CHOLHDL 3.4 09/10/2020 1007   LDLCALC 88 09/10/2020 1007     Wt Readings from Last 3 Encounters:  09/11/23 152 lb 6.4 oz (69.1 kg)  04/11/23 149 lb 9.6 oz (67.9 kg)  05/12/21 154 lb (69.9 kg)    Assessment and Plan:   1. Aortic valve disease s/p AVR: AVR working well by echo in January 2025. Continue ASA. Continue SBE prophylaxis when indicated.   2. CAD without angina: No chest pain suggestive of angina. LDL 69 April 2025. Continue ASA, Crestor , Zetia  and Toprol .   3. HTN: BP is well controlled. Continue Toprol .   Labs/ tests ordered today include:  No orders of the defined types were placed in this encounter.  Disposition:   F/U with me in 12  months  Signed, Antoinette Batman, MD, [facility] 09/11/2023 10:27 AM    [facility] [address] [place], [place], [place]  [address] Phone: [phone]; Fax: [phone]

## 2023-09-11 NOTE — Patient Instructions (Signed)
 Medication Instructions:  Your physician has recommended you make the following change in your medication:  1.) start amoxicillin 500 mg tablets - take FOUR tablets (2000 mg) one hour before any dental visits including cleanings  *If you need a refill on your cardiac medications before your next appointment, please call your pharmacy*  Lab Work: none If you have labs (blood work) drawn today and your tests are completely normal, you will receive your results only by: MyChart Message (if you have MyChart) OR A paper copy in the mail If you have any lab test that is abnormal or we need to change your treatment, we will call you to review the results.  Testing/Procedures: none  Follow-Up: At [facility], you and your health needs are our priority.  As part of our continuing mission to provide you with exceptional heart care, our providers are all part of one team.  This team includes your primary Cardiologist (physician) and Advanced Practice Providers or APPs (Physician Assistants and Nurse Practitioners) who all work together to provide you with the care you need, when you need it.  Your next appointment:   12 month(s)  Provider:   Antoinette Batman, MD

## 2024-04-29 ENCOUNTER — Other Ambulatory Visit: Payer: Self-pay | Admitting: Nurse Practitioner
